# Patient Record
Sex: Male | Born: 1960
Health system: Southern US, Community
[De-identification: ages and names within clinical notes are randomized; demographics above are authoritative.]

## PROBLEM LIST (undated history)

## (undated) DIAGNOSIS — J45909 Unspecified asthma, uncomplicated: Secondary | ICD-10-CM

## (undated) HISTORY — PX: NECK SURGERY: SHX720

---

## 2019-02-10 ENCOUNTER — Encounter (HOSPITAL_COMMUNITY): Payer: Self-pay | Admitting: Emergency Medicine

## 2019-02-10 ENCOUNTER — Other Ambulatory Visit: Payer: Self-pay

## 2019-02-10 ENCOUNTER — Emergency Department (HOSPITAL_COMMUNITY): Payer: Managed Care, Other (non HMO)

## 2019-02-10 ENCOUNTER — Inpatient Hospital Stay (HOSPITAL_COMMUNITY)
Admission: EM | Disposition: A | Discharge status: Home / Self Care | Payer: Self-pay | Source: Home / Self Care | Attending: Cardiovascular Disease

## 2019-02-10 ENCOUNTER — Inpatient Hospital Stay (HOSPITAL_COMMUNITY)
Admission: EM | Admit: 2019-02-10 | Discharge: 2019-02-12 | DRG: 247 | Disposition: A | Discharge status: Home / Self Care | Payer: Managed Care, Other (non HMO) | Attending: Cardiovascular Disease | Admitting: Cardiovascular Disease

## 2019-02-10 ENCOUNTER — Inpatient Hospital Stay (HOSPITAL_COMMUNITY): Payer: Managed Care, Other (non HMO)

## 2019-02-10 DIAGNOSIS — I10 Essential (primary) hypertension: Secondary | ICD-10-CM | POA: Diagnosis present

## 2019-02-10 DIAGNOSIS — J45909 Unspecified asthma, uncomplicated: Secondary | ICD-10-CM

## 2019-02-10 DIAGNOSIS — I213 ST elevation (STEMI) myocardial infarction of unspecified site: Secondary | ICD-10-CM | POA: Diagnosis present

## 2019-02-10 DIAGNOSIS — I2102 ST elevation (STEMI) myocardial infarction involving left anterior descending coronary artery: Principal | ICD-10-CM | POA: Diagnosis present

## 2019-02-10 DIAGNOSIS — Z23 Encounter for immunization: Secondary | ICD-10-CM

## 2019-02-10 DIAGNOSIS — F17210 Nicotine dependence, cigarettes, uncomplicated: Secondary | ICD-10-CM | POA: Diagnosis present

## 2019-02-10 DIAGNOSIS — Z955 Presence of coronary angioplasty implant and graft: Secondary | ICD-10-CM

## 2019-02-10 DIAGNOSIS — I5021 Acute systolic (congestive) heart failure: Secondary | ICD-10-CM | POA: Diagnosis not present

## 2019-02-10 DIAGNOSIS — I2510 Atherosclerotic heart disease of native coronary artery without angina pectoris: Secondary | ICD-10-CM | POA: Diagnosis present

## 2019-02-10 DIAGNOSIS — Z720 Tobacco use: Secondary | ICD-10-CM

## 2019-02-10 DIAGNOSIS — E785 Hyperlipidemia, unspecified: Secondary | ICD-10-CM | POA: Diagnosis present

## 2019-02-10 DIAGNOSIS — E782 Mixed hyperlipidemia: Secondary | ICD-10-CM | POA: Diagnosis not present

## 2019-02-10 DIAGNOSIS — Z20828 Contact with and (suspected) exposure to other viral communicable diseases: Secondary | ICD-10-CM | POA: Diagnosis present

## 2019-02-10 HISTORY — DX: Unspecified asthma, uncomplicated: J45.909

## 2019-02-10 HISTORY — PX: CORONARY/GRAFT ACUTE MI REVASCULARIZATION: CATH118305

## 2019-02-10 HISTORY — PX: CORONARY STENT INTERVENTION: CATH118234

## 2019-02-10 HISTORY — PX: LEFT HEART CATH AND CORONARY ANGIOGRAPHY: CATH118249

## 2019-02-10 LAB — PROTIME-INR
INR: 0.9 (ref 0.8–1.2)
Prothrombin Time: 12.5 s (ref 11.4–15.2)

## 2019-02-10 LAB — CBC
HCT: 44.9 % (ref 39.0–52.0)
Hemoglobin: 15.3 g/dL (ref 13.0–17.0)
MCH: 33 pg (ref 26.0–34.0)
MCHC: 34.1 g/dL (ref 30.0–36.0)
MCV: 96.8 fL (ref 80.0–100.0)
Platelets: 226 10*3/uL (ref 150–400)
RBC: 4.64 MIL/uL (ref 4.22–5.81)
RDW: 12.6 % (ref 11.5–15.5)
WBC: 7 10*3/uL (ref 4.0–10.5)
nRBC: 0 % (ref 0.0–0.2)

## 2019-02-10 LAB — BASIC METABOLIC PANEL
Anion gap: 11 (ref 5–15)
BUN: 19 mg/dL (ref 6–20)
CO2: 24 mmol/L (ref 22–32)
Calcium: 9.5 mg/dL (ref 8.9–10.3)
Chloride: 106 mmol/L (ref 98–111)
Creatinine, Ser: 1.18 mg/dL (ref 0.61–1.24)
GFR calc Af Amer: >60 mL/min (ref >60)
GFR calc non Af Amer: >60 mL/min (ref >60)
Glucose, Bld: 96 mg/dL (ref 70–99)
Potassium: 4.3 mmol/L (ref 3.5–5.1)
Sodium: 141 mmol/L (ref 135–145)

## 2019-02-10 LAB — LIPID PANEL
Cholesterol: 181 mg/dL (ref 0–200)
HDL: 48 mg/dL (ref >40)
LDL Cholesterol: 70 mg/dL (ref 0–99)
Total CHOL/HDL Ratio: 3.8 RATIO
Triglycerides: 316 mg/dL — ABNORMAL HIGH (ref <150)
VLDL: 63 mg/dL — ABNORMAL HIGH (ref 0–40)

## 2019-02-10 LAB — TROPONIN I (HIGH SENSITIVITY)
Troponin I (High Sensitivity): 98 ng/L — ABNORMAL HIGH
Troponin I (High Sensitivity): >27000 ng/L

## 2019-02-10 LAB — ECHOCARDIOGRAM COMPLETE
Height: 67 in
Weight: 3200 oz

## 2019-02-10 LAB — MRSA PCR SCREENING: MRSA by PCR: NEGATIVE

## 2019-02-10 LAB — SARS CORONAVIRUS 2 BY RT PCR (HOSPITAL ORDER, PERFORMED IN ~~LOC~~ HOSPITAL LAB): SARS Coronavirus 2: NEGATIVE

## 2019-02-10 LAB — APTT: aPTT: 26 s (ref 24–36)

## 2019-02-10 SURGERY — CORONARY/GRAFT ACUTE MI REVASCULARIZATION
Anesthesia: LOCAL

## 2019-02-10 MED ORDER — SODIUM CHLORIDE 0.9% FLUSH
3.0000 mL | INTRAVENOUS | Status: DC | PRN
  Administered 2019-02-12: 10:00:00 3 mL via INTRAVENOUS
  Filled 2019-02-10: qty 3

## 2019-02-10 MED ORDER — TICAGRELOR 90 MG PO TABS
ORAL_TABLET | ORAL | Status: AC
  Filled 2019-02-10: qty 2

## 2019-02-10 MED ORDER — NITROGLYCERIN 1 MG/10 ML FOR IR/CATH LAB
INTRA_ARTERIAL | Status: AC
  Filled 2019-02-10: qty 10

## 2019-02-10 MED ORDER — SODIUM CHLORIDE 0.9 % IV SOLN: 1.7500 mg/kg/h | Freq: Once | INTRAVENOUS | Status: DC

## 2019-02-10 MED ORDER — HEPARIN SODIUM (PORCINE) 5000 UNIT/ML IJ SOLN
4000.0000 [IU] | Freq: Once | INTRAMUSCULAR | Status: AC
  Administered 2019-02-10: 04:00:00 4000 [IU] via INTRAVENOUS
  Filled 2019-02-10: qty 1

## 2019-02-10 MED ORDER — BIVALIRUDIN BOLUS VIA INFUSION - CUPID
INTRAVENOUS | Status: DC | PRN
  Administered 2019-02-10: 05:00:00 68.025 mg via INTRAVENOUS

## 2019-02-10 MED ORDER — ACETAMINOPHEN 325 MG PO TABS: 650.0000 mg | ORAL_TABLET | ORAL | Status: DC | PRN

## 2019-02-10 MED ORDER — SODIUM CHLORIDE 0.9 % IV SOLN
INTRAVENOUS | Status: AC | PRN
  Administered 2019-02-10: 10 mL/h via INTRAVENOUS

## 2019-02-10 MED ORDER — ASPIRIN 81 MG PO CHEW
324.0000 mg | CHEWABLE_TABLET | Freq: Once | ORAL | Status: AC
  Administered 2019-02-10: 324 mg via ORAL
  Filled 2019-02-10: qty 4

## 2019-02-10 MED ORDER — SODIUM CHLORIDE 0.9% FLUSH: 3.0000 mL | Freq: Once | INTRAVENOUS | Status: DC

## 2019-02-10 MED ORDER — BIVALIRUDIN TRIFLUOROACETATE 250 MG IV SOLR
INTRAVENOUS | Status: AC
  Filled 2019-02-10: qty 250

## 2019-02-10 MED ORDER — HEPARIN (PORCINE) IN NACL 1000-0.9 UT/500ML-% IV SOLN
INTRAVENOUS | Status: AC
  Filled 2019-02-10: qty 1500

## 2019-02-10 MED ORDER — FENTANYL CITRATE (PF) 100 MCG/2ML IJ SOLN
INTRAMUSCULAR | Status: AC
  Filled 2019-02-10: qty 2

## 2019-02-10 MED ORDER — VERAPAMIL HCL 2.5 MG/ML IV SOLN
INTRAVENOUS | Status: AC
  Filled 2019-02-10: qty 2

## 2019-02-10 MED ORDER — ATORVASTATIN CALCIUM 80 MG PO TABS
80.0000 mg | ORAL_TABLET | Freq: Every day | ORAL | Status: DC
  Administered 2019-02-10 – 2019-02-11 (×2): 80 mg via ORAL
  Filled 2019-02-10 (×2): qty 1

## 2019-02-10 MED ORDER — TICAGRELOR 90 MG PO TABS
ORAL_TABLET | ORAL | Status: DC | PRN
  Administered 2019-02-10: 180 mg via ORAL

## 2019-02-10 MED ORDER — CHLORHEXIDINE GLUCONATE CLOTH 2 % EX PADS
6.0000 | MEDICATED_PAD | Freq: Every day | CUTANEOUS | Status: DC
  Administered 2019-02-11: 6 via TOPICAL

## 2019-02-10 MED ORDER — ONDANSETRON HCL 4 MG/2ML IJ SOLN
INTRAMUSCULAR | Status: DC | PRN
  Administered 2019-02-10: 4 mg via INTRAVENOUS

## 2019-02-10 MED ORDER — TICAGRELOR 90 MG PO TABS
90.0000 mg | ORAL_TABLET | Freq: Two times a day (BID) | ORAL | Status: DC
  Administered 2019-02-10 – 2019-02-12 (×4): 90 mg via ORAL
  Filled 2019-02-10 (×4): qty 1

## 2019-02-10 MED ORDER — FENTANYL CITRATE (PF) 100 MCG/2ML IJ SOLN
INTRAMUSCULAR | Status: DC | PRN
  Administered 2019-02-10: 25 ug via INTRAVENOUS

## 2019-02-10 MED ORDER — SODIUM CHLORIDE 0.9 % IV SOLN
INTRAVENOUS | Status: DC
  Administered 2019-02-10: 04:00:00 20 mL/h via INTRAVENOUS

## 2019-02-10 MED ORDER — METOPROLOL TARTRATE 12.5 MG HALF TABLET
12.5000 mg | ORAL_TABLET | Freq: Two times a day (BID) | ORAL | Status: DC
  Administered 2019-02-10 – 2019-02-11 (×4): 12.5 mg via ORAL
  Filled 2019-02-10 (×5): qty 1

## 2019-02-10 MED ORDER — HEPARIN SODIUM (PORCINE) 1000 UNIT/ML IJ SOLN
INTRAMUSCULAR | Status: DC | PRN
  Administered 2019-02-10: 2000 [IU] via INTRAVENOUS

## 2019-02-10 MED ORDER — LIDOCAINE HCL (PF) 1 % IJ SOLN
INTRAMUSCULAR | Status: AC
  Filled 2019-02-10: qty 30

## 2019-02-10 MED ORDER — VERAPAMIL HCL 2.5 MG/ML IV SOLN
INTRAVENOUS | Status: DC | PRN
  Administered 2019-02-10: 10 mL via INTRA_ARTERIAL

## 2019-02-10 MED ORDER — LABETALOL HCL 5 MG/ML IV SOLN: 10.0000 mg | INTRAVENOUS | Status: AC | PRN

## 2019-02-10 MED ORDER — PERFLUTREN LIPID MICROSPHERE
1.0000 mL | INTRAVENOUS | Status: AC | PRN
  Administered 2019-02-10: 1.5 mL via INTRAVENOUS
  Filled 2019-02-10: qty 10

## 2019-02-10 MED ORDER — HYDRALAZINE HCL 20 MG/ML IJ SOLN: 10.0000 mg | INTRAMUSCULAR | Status: AC | PRN

## 2019-02-10 MED ORDER — LIDOCAINE HCL (PF) 1 % IJ SOLN
INTRAMUSCULAR | Status: DC | PRN
  Administered 2019-02-10: 2 mL

## 2019-02-10 MED ORDER — ASPIRIN 81 MG PO CHEW
81.0000 mg | CHEWABLE_TABLET | Freq: Every day | ORAL | Status: DC
  Administered 2019-02-10 – 2019-02-12 (×3): 81 mg via ORAL
  Filled 2019-02-10 (×3): qty 1

## 2019-02-10 MED ORDER — HEPARIN SODIUM (PORCINE) 1000 UNIT/ML IJ SOLN
INTRAMUSCULAR | Status: AC
  Filled 2019-02-10: qty 1

## 2019-02-10 MED ORDER — SODIUM CHLORIDE 0.9% FLUSH
3.0000 mL | Freq: Two times a day (BID) | INTRAVENOUS | Status: DC
  Administered 2019-02-10 – 2019-02-11 (×3): 3 mL via INTRAVENOUS

## 2019-02-10 MED ORDER — HEPARIN (PORCINE) IN NACL 1000-0.9 UT/500ML-% IV SOLN
INTRAVENOUS | Status: DC | PRN
  Administered 2019-02-10: 500 mL

## 2019-02-10 MED ORDER — MIDAZOLAM HCL 2 MG/2ML IJ SOLN
INTRAMUSCULAR | Status: AC
  Filled 2019-02-10: qty 2

## 2019-02-10 MED ORDER — SODIUM CHLORIDE 0.9 % IV SOLN: 250.0000 mL | INTRAVENOUS | Status: DC | PRN

## 2019-02-10 MED ORDER — SODIUM CHLORIDE 0.9 % IV SOLN
INTRAVENOUS | Status: DC
  Administered 2019-02-10: 12:00:00 via INTRAVENOUS

## 2019-02-10 MED ORDER — IOHEXOL 350 MG/ML SOLN
INTRAVENOUS | Status: DC | PRN
  Administered 2019-02-10: 06:00:00 190 mL

## 2019-02-10 MED ORDER — ONDANSETRON HCL 4 MG/2ML IJ SOLN
INTRAMUSCULAR | Status: AC
  Filled 2019-02-10: qty 2

## 2019-02-10 MED ORDER — ONDANSETRON HCL 4 MG/2ML IJ SOLN: 4.0000 mg | Freq: Four times a day (QID) | INTRAMUSCULAR | Status: DC | PRN

## 2019-02-10 MED ORDER — NITROGLYCERIN 0.4 MG SL SUBL
0.4000 mg | SUBLINGUAL_TABLET | SUBLINGUAL | Status: DC | PRN
  Administered 2019-02-10 (×3): 0.4 mg via SUBLINGUAL
  Filled 2019-02-10: qty 1

## 2019-02-10 MED ORDER — ALBUTEROL SULFATE (2.5 MG/3ML) 0.083% IN NEBU: 3.0000 mL | INHALATION_SOLUTION | Freq: Four times a day (QID) | RESPIRATORY_TRACT | Status: DC | PRN

## 2019-02-10 MED ORDER — MIDAZOLAM HCL 2 MG/2ML IJ SOLN
INTRAMUSCULAR | Status: DC | PRN
  Administered 2019-02-10: 2 mg via INTRAVENOUS

## 2019-02-10 MED ORDER — SODIUM CHLORIDE 0.9 % IV SOLN
INTRAVENOUS | Status: AC | PRN
  Administered 2019-02-10 (×2): 1.75 mg/kg/h via INTRAVENOUS

## 2019-02-10 SURGICAL SUPPLY — 19 items
BALLN SAPPHIRE 2.0X12 (BALLOONS) ×2
BALLN SAPPHIRE 2.5X15 (BALLOONS) ×2
BALLN SAPPHIRE ~~LOC~~ 3.25X18 (BALLOONS) ×2 IMPLANT
BALLOON SAPPHIRE 2.0X12 (BALLOONS) ×1 IMPLANT
BALLOON SAPPHIRE 2.5X15 (BALLOONS) ×1 IMPLANT
CATH INFINITI JR4 5F (CATHETERS) ×2 IMPLANT
CATH OPTITORQUE TIG 4.0 5F (CATHETERS) ×2 IMPLANT
CATH VISTA GUIDE 6FR XBLAD3.5 (CATHETERS) ×2 IMPLANT
DEVICE RAD COMP TR BAND LRG (VASCULAR PRODUCTS) ×2 IMPLANT
GLIDESHEATH SLEND SS 6F .021 (SHEATH) ×2 IMPLANT
GUIDEWIRE INQWIRE 1.5J.035X260 (WIRE) ×1 IMPLANT
INQWIRE 1.5J .035X260CM (WIRE) ×2
KIT ENCORE 26 ADVANTAGE (KITS) ×2 IMPLANT
KIT HEART LEFT (KITS) ×2 IMPLANT
PACK CARDIAC CATHETERIZATION (CUSTOM PROCEDURE TRAY) ×2 IMPLANT
STENT SYNERGY DES 3X32 (Permanent Stent) ×2 IMPLANT
TRANSDUCER W/STOPCOCK (MISCELLANEOUS) ×2 IMPLANT
TUBING CIL FLEX 10 FLL-RA (TUBING) ×2 IMPLANT
WIRE PT2 MS 185 (WIRE) ×2 IMPLANT

## 2019-02-10 NOTE — ED Triage Notes (Addendum)
C/o chest pressure to center of chest that radiates up into neck that woke him up this morning.  Denies SOB.  Reports nausea and vomiting this AM.  States this has happened every other night x 3.  States today is first day with nausea and vomiting.  EKG on arrival shows STEMI.  Pt straight to room after EKG.

## 2019-02-10 NOTE — Plan of Care (Signed)
  Problem: Education: Goal: Knowledge of General Education information will improve Description: Including pain rating scale, medication(s)/side effects and non-pharmacologic comfort measures Outcome: Progressing   Problem: Health Behavior/Discharge Planning: Goal: Ability to manage health-related needs will improve Outcome: Progressing   Problem: Clinical Measurements: Goal: Ability to maintain clinical measurements within normal limits will improve Outcome: Progressing Goal: Will remain free from infection Outcome: Progressing Goal: Respiratory complications will improve Outcome: Progressing Goal: Cardiovascular complication will be avoided Outcome: Progressing   Problem: Activity: Goal: Risk for activity intolerance will decrease Outcome: Progressing   Problem: Nutrition: Goal: Adequate nutrition will be maintained Outcome: Progressing   Problem: Coping: Goal: Level of anxiety will decrease Outcome: Progressing   Problem: Elimination: Goal: Will not experience complications related to bowel motility Outcome: Progressing Goal: Will not experience complications related to urinary retention Outcome: Progressing   Problem: Pain Managment: Goal: General experience of comfort will improve Outcome: Progressing   Problem: Safety: Goal: Ability to remain free from injury will improve Outcome: Progressing   Problem: Skin Integrity: Goal: Risk for impaired skin integrity will decrease Outcome: Progressing   Problem: Activity: Goal: Ability to return to baseline activity level will improve Outcome: Progressing   Problem: Cardiovascular: Goal: Ability to achieve and maintain adequate cardiovascular perfusion will improve Outcome: Progressing Goal: Vascular access site(s) Level 0-1 will be maintained Outcome: Completed/Met   Problem: Health Behavior/Discharge Planning: Goal: Ability to safely manage health-related needs after discharge will improve Outcome:  Progressing

## 2019-02-10 NOTE — Progress Notes (Signed)
Echocardiogram 2D Echocardiogram has been performed.  Oneal Deputy Dmarion Perfect 02/10/2019, 9:13 AM

## 2019-02-10 NOTE — ED Provider Notes (Signed)
[facility] Note   CSN: [id number] Arrival date & time: 02/10/19  0345     History   Chief Complaint Chief Complaint  Patient presents with  . Code STEMI   Level 5 caveat due to acuity of condition HPI Cory Berry is a 58 y.o. male.     The history is provided by the patient.  Chest Pain Pain location:  Substernal area Pain quality: pressure   Pain radiates to:  Neck Pain severity:  Severe Onset quality:  Sudden Duration:  1 hour Timing:  Constant Progression:  Worsening Chronicity:  New Relieved by:  Nothing Worsened by:  Nothing Associated symptoms: diaphoresis and weakness   Patient history of asthma presents with chest pain.  He reports he has had chest pain over the past 3 nights.  Tonight he woke up with severe pain that went into his neck Denies known history of CAD. Patient is a smoker Past Medical History:  Diagnosis Date  . Asthma     There are no active problems to display for this patient.   Past Surgical History:  Procedure Laterality Date  . NECK SURGERY          Home Medications    Prior to Admission medications   Not on File    Family History No family history on file.  Social History Social History   Tobacco Use  . Smoking status: Current Every Day Smoker  . Smokeless tobacco: Never Used  Substance Use Topics  . Alcohol use: Yes  . Drug use: Never     Allergies   Patient has no allergy information on record.   Review of Systems Review of Systems  Unable to perform ROS: Acuity of condition  Constitutional: Positive for diaphoresis.  Cardiovascular: Positive for chest pain.  Neurological: Positive for weakness.     Physical Exam Updated Vital Signs There were no vitals taken for this visit.  Physical Exam CONSTITUTIONAL: Well developed/well nourished, uncomfortable appearing HEAD: Normocephalic/atraumatic EYES: EOMI ENMT: Mucous membranes moist NECK: supple  no meningeal signs SPINE/BACK:entire spine nontender CV: S1/S2 noted, no murmurs/rubs/gallops noted LUNGS: Lungs are clear to auscultation bilaterally, no apparent distress ABDOMEN: soft, nontender NEURO: Pt is awake/alert/appropriate, moves all extremitiesx4.  No facial droop.   EXTREMITIES: pulses normal/equalx4, full ROM SKIN: warm, color normal PSYCH: Mildly anxious  ED Treatments / Results  Labs (all labs ordered are listed, but only abnormal results are displayed) Labs Reviewed  SARS CORONAVIRUS 2 (HOSPITAL ORDER, PERFORMED IN [facility])  CBC  PROTIME-INR  APTT  BASIC METABOLIC PANEL  LIPID PANEL  TROPONIN I (HIGH SENSITIVITY)    EKG EKG Interpretation  Date/Time:  Saturday February 10 2019 03:49:27 EDT Ventricular Rate:  105 PR Interval:  160 QRS Duration: 80 QT Interval:  324 QTC Calculation: 428 R Axis:   83 Text Interpretation:  Sinus tachycardia Septal infarct , age undetermined Lateral injury pattern ** ** ** * * ACUTE MI  ** ** ** ** Abnormal ECG No previous ECGs available Confirmed by Zadie RhineWickline, Donatella Walski ([id number]) on 02/10/2019 4:03:44 AM   Radiology Dg Chest Port 1 View  Result Date: 02/10/2019 CLINICAL DATA:  C/o chest pressure to center of chest that radiates up into neck that woke him up this morning. Denies SOB. Reports nausea and vomiting this AM. States this has happened every other night x 3. States today is first day with nausea and vomiting. EKG on arrival shows STEMI. Pt straight to room after  EKG EXAM: PORTABLE CHEST 1 VIEW COMPARISON:  None. FINDINGS: Cardiac silhouette is normal in size. Normal mediastinal and hilar contours. Clear lungs.  No pleural effusion or pneumothorax. Skeletal structures are grossly intact. IMPRESSION: No active disease. Electronically Signed   By: Lajean Manes M.D.   On: 02/10/2019 04:15    Procedures .Critical Care Performed by: Ripley Fraise, MD Authorized by: Ripley Fraise, MD   Critical care  provider statement:    Critical care time (minutes):  31   Critical care start time:  02/10/2019 4:09 AM   Critical care end time:  02/10/2019 4:40 AM   Critical care time was exclusive of:  Separately billable procedures and treating other patients   Critical care was necessary to treat or prevent imminent or life-threatening deterioration of the following conditions:  Cardiac failure   Critical care was time spent personally by me on the following activities:  Discussions with consultants, evaluation of patient's response to treatment, examination of patient, pulse oximetry, re-evaluation of patient's condition, ordering and review of radiographic studies and ordering and review of laboratory studies   I assumed direction of critical care for this patient from another provider in my specialty: no       Medications Ordered in ED Medications  sodium chloride flush (NS) 0.9 % injection 3 mL ( Intravenous MAR Hold 02/10/19 0443)  0.9 %  sodium chloride infusion (20 mL/hr Intravenous New Bag/Given 02/10/19 0422)  nitroGLYCERIN (NITROSTAT) SL tablet 0.4 mg ( Sublingual MAR Hold 02/10/19 0443)  ondansetron (ZOFRAN) injection (4 mg Intravenous Given 02/10/19 0452)  aspirin chewable tablet 324 mg (324 mg Oral Given 02/10/19 0406)  heparin injection 4,000 Units (4,000 Units Intravenous Given 02/10/19 0407)  verapamil (ISOPTIN) 2.5 MG/ML injection (has no administration in time range)  heparin 1000 UNIT/ML injection (has no administration in time range)  lidocaine (PF) (XYLOCAINE) 1 % injection (has no administration in time range)  Heparin (Porcine) in NaCl 1000-0.9 UT/500ML-% SOLN (has no administration in time range)  ondansetron (ZOFRAN) 4 MG/2ML injection (has no administration in time range)     Initial Impression / Assessment and Plan / ED Course  I have reviewed the triage vital signs and the nursing notes.  Pertinent labs & imaging results that were available during my care of the patient were  reviewed by me and considered in my medical decision making (see chart for details).        Immediately upon review of EKG I paged out a code STEMI.  Repeat EKG reveals continued ST elevation.  Discussed via phone with Dr. Claiborne Billings with cardiology.  Patient will go emergently to the cardiac Cath Lab 4:19 AM Cardiology at bedside Pt has some improvement in pain with NTG  Final Clinical Impressions(s) / ED Diagnoses   Final diagnoses:  ST elevation myocardial infarction (STEMI), unspecified artery [facility])    ED Discharge Orders    None       Ripley Fraise, MD 02/10/19 [phone]

## 2019-02-10 NOTE — Progress Notes (Signed)
Progress Note  Patient Name: Cory Berry Date of Encounter: 02/10/2019  Primary Cardiologist: No primary care provider on file.   Subjective   No chest pain or sob.  Inpatient Medications    Scheduled Meds: . aspirin  81 mg Oral Daily  . atorvastatin  80 mg Oral q1800  . Chlorhexidine Gluconate Cloth  6 each Topical Daily  . metoprolol tartrate  12.5 mg Oral BID  . sodium chloride flush  3 mL Intravenous Once  . sodium chloride flush  3 mL Intravenous Q12H  . ticagrelor  90 mg Oral BID   Continuous Infusions: . sodium chloride 125 mL/hr at 02/10/19 0701  . sodium chloride    . bivalirudin (ANGIOMAX) infusion 5 mg/mL (Cath Lab,ACS,PCI indication)     PRN Meds: sodium chloride, acetaminophen, albuterol, hydrALAZINE, labetalol, nitroGLYCERIN, ondansetron (ZOFRAN) IV, perflutren lipid microspheres (DEFINITY) IV suspension, sodium chloride flush   Vital Signs    Vitals:   02/10/19 0600 02/10/19 0700 02/10/19 0748 02/10/19 0800  BP: 122/82 118/87  130/81  Pulse: 88 85  85  Resp:  17  15  Temp:   98.3 F (36.8 C)   TempSrc:   Oral   SpO2: 99% 100%  99%  Weight:      Height:        Intake/Output Summary (Last 24 hours) at 02/10/2019 0944 Last data filed at 02/10/2019 0800 Gross per 24 hour  Intake 246.44 ml  Output -  Net 246.44 ml   Filed Weights   02/10/19 0412  Weight: 90.7 kg    Telemetry    nsr - Personally Reviewed  ECG    nsr with high lateral ST elevation  - Personally Reviewed  Physical Exam   GEN: No acute distress.   Neck: No JVD Cardiac: RRR, no murmurs, rubs, or gallops.  Respiratory: Clear to auscultation bilaterally. GI: Soft, nontender, non-distended  MS: No edema; No deformity. Neuro:  Nonfocal  Psych: Normal affect   Labs    Chemistry Recent Labs  Lab 02/10/19 0357  NA 141  K 4.3  CL 106  CO2 24  GLUCOSE 96  BUN 19  CREATININE 1.18  CALCIUM 9.5  GFRNONAA >60  GFRAA >60  ANIONGAP 11     Hematology Recent Labs   Lab 02/10/19 0357  WBC 7.0  RBC 4.64  HGB 15.3  HCT 44.9  MCV 96.8  MCH 33.0  MCHC 34.1  RDW 12.6  PLT 226    Cardiac EnzymesNo results for input(s): TROPONINI in the last 168 hours. No results for input(s): TROPIPOC in the last 168 hours.   BNPNo results for input(s): BNP, PROBNP in the last 168 hours.   DDimer No results for input(s): DDIMER in the last 168 hours.   Radiology    Dg Chest Port 1 View  Result Date: 02/10/2019 CLINICAL DATA:  C/o chest pressure to center of chest that radiates up into neck that woke him up this morning. Denies SOB. Reports nausea and vomiting this AM. States this has happened every other night x 3. States today is first day with nausea and vomiting. EKG on arrival shows STEMI. Pt straight to room after EKG EXAM: PORTABLE CHEST 1 VIEW COMPARISON:  None. FINDINGS: Cardiac silhouette is normal in size. Normal mediastinal and hilar contours. Clear lungs.  No pleural effusion or pneumothorax. Skeletal structures are grossly intact. IMPRESSION: No active disease. Electronically Signed   By: Lajean Manes M.D.   On: 02/10/2019 04:15    Cardiac  Studies   Left heart cath with PCI  Patient Profile     58 y.o. male admitted with a high lateral AMI and found to have an occluded LAD  Assessment & Plan    1. Acute anterolateral MI - he is doing well s/p PCI with stenting of the LAD. Continue ticagrelor and ASA.  2. Tobacco abuse - he stopped in the past for 23 years. I encouraged him to stop smoking.  3. Dyslipidemia - he has started high dose atorvastatin.  For questions or updates, please contact [facility] Please consult [email or web address] for contact info under Cardiology/STEMI.      Signed, Lewayne BuntingGregg Fedra Lanter, MD  02/10/2019, 9:44 AM  Patient ID: Cory Berry, male   DOB: 1960/12/27, 58 y.o.   MRN: [id number]

## 2019-02-10 NOTE — Progress Notes (Signed)
Responded to code STEMI, placed on zoll, assisted with transport to cath lab.

## 2019-02-10 NOTE — Progress Notes (Signed)
   02/10/19 0800  Clinical Encounter Type  Visited With Family;Patient and family together;Health care provider  Visit Type Initial;ED;Code  Referral From Other (Comment) (code STEMI)  Spiritual Encounters  Spiritual Needs Emotional  Stress Factors  Patient Stress Factors Loss of control;Health changes  Family Stress Factors Loss of control   Chaplain called, then presented to ED for STEMI.  Met w/ pt's wife, who was especially worried in light of her father's previous death in cardiac surgery.  Walked her to [facility] waiting, team aware, compassionate presence w/ her for a time.  Aware that she could go to [place] to ask to page chaplain if desired I return.  Temple Pacini [place], [phone]

## 2019-02-10 NOTE — Progress Notes (Signed)
CARDIAC REHAB PHASE I   Education completed including stent card information, medications, risk factors, diet, and smoking cessation.  Pt has quit for numerous years before and feels that he will be able to quit without the use of an aid.  All questions answered.  Verbalized understanding.  CRP2 Referral made to [facility] d/t its proximity to the pt's work.  Noel Christmas, RN 02/10/2019 2:45 PM

## 2019-02-10 NOTE — Plan of Care (Signed)
  Problem: Education: Goal: Knowledge of General Education information will improve Description: Including pain rating scale, medication(s)/side effects and non-pharmacologic comfort measures Outcome: Progressing   Problem: Clinical Measurements: Goal: Ability to maintain clinical measurements within normal limits will improve Outcome: Progressing Goal: Will remain free from infection Outcome: Progressing Goal: Respiratory complications will improve Outcome: Progressing Goal: Cardiovascular complication will be avoided Outcome: Progressing   Problem: Activity: Goal: Risk for activity intolerance will decrease Outcome: Progressing   Problem: Coping: Goal: Level of anxiety will decrease Outcome: Progressing   Problem: Elimination: Goal: Will not experience complications related to urinary retention Outcome: Progressing   Problem: Pain Managment: Goal: General experience of comfort will improve Outcome: Progressing   Problem: Safety: Goal: Ability to remain free from injury will improve Outcome: Progressing   Problem: Skin Integrity: Goal: Risk for impaired skin integrity will decrease Outcome: Progressing   Problem: Education: Goal: Understanding of CV disease, CV risk reduction, and recovery process will improve Outcome: Progressing   Problem: Activity: Goal: Ability to return to baseline activity level will improve Outcome: Progressing   Problem: Cardiovascular: Goal: Ability to achieve and maintain adequate cardiovascular perfusion will improve Outcome: Progressing

## 2019-02-10 NOTE — ED Notes (Signed)
Cardiology at the bedside.

## 2019-02-10 NOTE — H&P (Addendum)
Cardiology Admission History and Physical:   Patient ID: Markice Halliwell MRN: [id number]; DOB: 1960/07/21   Admission date: 02/10/2019  Primary Care Provider: No primary care provider on file. Primary Cardiologist: No primary care provider on file.  Primary Electrophysiologist:  None   Chief Complaint:  Chest pain  Patient Profile:   Chosen Beilke is a 58 y.o. male with PMH significant for tobacco abuse and asthma who presents with chest pain with ECG consistent with STEMI.  History of Present Illness:   Mr. Steimer was in his usual state of health until the past 6 days when he started to develop intermittent chest pain at night. He woke up from sleep about 6 nights ago with chest pain. He called EMS who evaluated him and noted his vitals and ECG to be normal. His chest pain resolved. He did not have chest pain the following night but then did the third night. His pain was similar that time and he took some nexium and went back to bed. Last evening, he did not have chest pain again. Tonight, he woke up from sleep with the same chest pain that he describes as a squeezing discomfort in the center of his chest. This time was worse and associated with nausea, vomiting, and diaphoresis. He and his wife drove to the ER given his symptoms. His ECG was remarkable for ST elevation in V5 and V6 with reciprocal changes. He was given 3 SL nitro tablets with improvement in his pain to 3/10 but persistent discomfort. He was brought to the cath lab where he was found to have an occlusion of his LAD.   He smokes 2+ppd and has for about the past 12 years. He has 2-4 beers each night. He denies any drug use. 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 He typically walks multiple miles in a day and denies having any chest pain or shortness of breath with exertion. His parents and two siblings are all alive and healthy. He has no family history of coronary disease. Of note, he has not been to see a  doctor since he moved to the [place] 12 years ago.   Heart Pathway Score:     Past Medical History:  Diagnosis Date  . Asthma     Past Surgical History:  Procedure Laterality Date  . NECK SURGERY       Medications Prior to Admission: None  Allergies:   None  Social History:   Lives at home with his wife. Smokes ~2ppd. Drinks 2-4 beers a day. No drug use. Works as an Nature conservation officer.    Family History:   No family history of heart disease.    ROS:  Please see the history of present illness.  All other ROS reviewed and negative.     Physical Exam/Data:   Vitals:   02/10/19 0414 02/10/19 0415 02/10/19 0421 02/10/19 0430  BP:  (!) 139/96 (!) 139/96 114/70  Pulse: (!) 107 (!) 108 (!) 103 98  Resp: 17 14 14 18   Temp:   97.9 F (36.6 C)   TempSrc:   Oral   SpO2: 100% 100% 100% 100%  Weight:      Height:       No intake or output data in the 24 hours ending 02/10/19 0455 Last 3 Weights 02/10/2019  Weight (lbs) 200 lb  Weight (kg) 90.719 kg     Body mass index is 31.32 kg/m.  General:  Well nourished, well developed, diaphoretic, lying  comfortably in bed HEENT: normal Lymph: no adenopathy Neck: no JVD Vascular: No carotid bruits; FA pulses 2+ bilaterally without bruits  Cardiac:  normal S1, S2; RRR; no murmurs, rubs or gallops Lungs:  clear to auscultation bilaterally, no wheezing, rhonchi or rales  Abd: soft, nontender, no hepatomegaly  Ext: no edema, warm and well perfused Musculoskeletal:  No deformities, BUE and BLE strength normal and equal Skin: warm and dry  Neuro:  CNs 2-12 intact, no focal abnormalities noted Psych:  Normal affect    EKG:  The ECG that was done and was personally reviewed and demonstrates normal sinus rhythm with ST elevation in V5-V6  Relevant CV Studies: None  Laboratory Data:  High Sensitivity Troponin:  No results for input(s): TROPONINIHS in the last 720 hours.    ChemistryNo results for input(s): NA, K, CL, CO2, GLUCOSE,  BUN, CREATININE, CALCIUM, GFRNONAA, GFRAA, ANIONGAP in the last 168 hours.  No results for input(s): PROT, ALBUMIN, AST, ALT, ALKPHOS, BILITOT in the last 168 hours. Hematology Recent Labs  Lab 02/10/19 0357  WBC 7.0  RBC 4.64  HGB 15.3  HCT 44.9  MCV 96.8  MCH 33.0  MCHC 34.1  RDW 12.6  PLT 226   BNPNo results for input(s): BNP, PROBNP in the last 168 hours.  DDimer No results for input(s): DDIMER in the last 168 hours.   Radiology/Studies:  Dg Chest Port 1 View  Result Date: 02/10/2019 CLINICAL DATA:  C/o chest pressure to center of chest that radiates up into neck that woke him up this morning. Denies SOB. Reports nausea and vomiting this AM. States this has happened every other night x 3. States today is first day with nausea and vomiting. EKG on arrival shows STEMI. Pt straight to room after EKG EXAM: PORTABLE CHEST 1 VIEW COMPARISON:  None. FINDINGS: Cardiac silhouette is normal in size. Normal mediastinal and hilar contours. Clear lungs.  No pleural effusion or pneumothorax. Skeletal structures are grossly intact. IMPRESSION: No active disease. Electronically Signed   By: Lajean Manes M.D.   On: 02/10/2019 04:15    Assessment and Plan:   Aloysius Heinle is a 58 y.o. male with PMH significant for tobacco abuse and asthma who presents with chest pain with ECG consistent with STEMI.   LAD STEMI- Presented with chest pain and diaphoresis and was found to have an occluded LAD s/p PCI.  - S/p aspirin 324mg   - Start aspirin 81mg  daily - S/p ticagrelor 180mg   - Continue ticagrelor 90mg  BID  - Start lipitor 80mg   - Check HgA1c, lipid panel and TSH - Start metoprolol tartrate 12.5mg  BID  - Referral to cardiac rehab at discharge  - Encourage smoking cessation  Asthma - Continue albuterol PRN   FEN/GI - Regular diet - DVT PPx: Hold given cath - GI PPx: Not indicated  Code status: Full code    Severity of Illness: The appropriate patient status for this patient is  INPATIENT. Inpatient status is judged to be reasonable and necessary in order to provide the required intensity of service to ensure the patient's safety. The patient's presenting symptoms, physical exam findings, and initial radiographic and laboratory data in the context of their chronic comorbidities is felt to place them at high risk for further clinical deterioration. Furthermore, it is not anticipated that the patient will be medically stable for discharge from the hospital within 2 midnights of admission. The following factors support the patient status of inpatient.   " The patient's presenting symptoms include chest  pain and diaphoresis. " The worrisome physical exam findings include diaphoresis. " The initial radiographic and laboratory data are worrisome because of STEMI criteria on ECG. " The chronic co-morbidities include asthma.   * I certify that at the point of admission it is my clinical judgment that the patient will require inpatient hospital care spanning beyond 2 midnights from the point of admission due to high intensity of service, high risk for further deterioration and high frequency of surveillance required.*    For questions or updates, please contact [facility] Please consult [email or web address] for contact info under    Signed, Nicoletta Baoniglio, Amanda C, MD  02/10/2019 4:55 AM    Patient seen and examined. Agree with assessment and plan.  Mr. Maisie Fushomas is a 58 year old gentleman who is originally from the [place] who presented this evening to the emergency room after awakening from sleep with severe substernal chest pain associated with nausea vomiting and diaphoresis.  Over the past 6 days he has experienced 2 other episodes of similar chest discomfort but this evening's was most pronounced.  Several days ago EMS had gone to his house doing his symptoms and ECG did not show any acute ST-T changes.  Upon presentation to the emergency room, ECG revealed acute lateral ST segment  elevation involving leads I, aVL, V5 and V6 with inferior T wave inversion.  He was treated with heparin 4000 units and aspirin and transported emergently to the cardiac catheterization laboratory.  The procedure was discussed with the patient.  He has a history of asthma.  He had not been on any prior significant home medications except as needed albuterol.  He has not seen a physician since moving to the [place] 12 years ago.  Urgent catheterization was performed which revealed total occlusion of a large LAD which was successfully intervened upon with ultimate insertion of a 3.0 x 32 mm Synergy DES stent postdilated to 3.3 millimeters.  Following access for intervention, chest pain completely resolved.  He will be transported to the CCU for further observation and treatment.  Will DC bivalirudin following completion of his current second bag. We will plan echo Doppler study to assess LV function.  Will initiate low-dose beta-blocker and high potency statin therapy.  Continue DAPT for minimum of 1 year following this acute coronary syndrome/STEMI.  Lennette Biharihomas A. Kelly, MD, [facility] 02/10/2019 6:10 AM

## 2019-02-11 LAB — BASIC METABOLIC PANEL
Anion gap: 9 (ref 5–15)
BUN: 14 mg/dL (ref 6–20)
CO2: 22 mmol/L (ref 22–32)
Calcium: 8.8 mg/dL — ABNORMAL LOW (ref 8.9–10.3)
Chloride: 106 mmol/L (ref 98–111)
Creatinine, Ser: 1.12 mg/dL (ref 0.61–1.24)
GFR calc Af Amer: >60 mL/min (ref >60)
GFR calc non Af Amer: >60 mL/min (ref >60)
Glucose, Bld: 102 mg/dL — ABNORMAL HIGH (ref 70–99)
Potassium: 3.9 mmol/L (ref 3.5–5.1)
Sodium: 137 mmol/L (ref 135–145)

## 2019-02-11 LAB — CBC
HCT: 40.6 % (ref 39.0–52.0)
Hemoglobin: 13.4 g/dL (ref 13.0–17.0)
MCH: 32.1 pg (ref 26.0–34.0)
MCHC: 33 g/dL (ref 30.0–36.0)
MCV: 97.1 fL (ref 80.0–100.0)
Platelets: 194 10*3/uL (ref 150–400)
RBC: 4.18 MIL/uL — ABNORMAL LOW (ref 4.22–5.81)
RDW: 12.7 % (ref 11.5–15.5)
WBC: 9.7 10*3/uL (ref 4.0–10.5)
nRBC: 0 % (ref 0.0–0.2)

## 2019-02-11 MED ORDER — INFLUENZA VAC SPLIT QUAD 0.5 ML IM SUSY
0.5000 mL | PREFILLED_SYRINGE | INTRAMUSCULAR | Status: DC | PRN
  Filled 2019-02-11: qty 0.5

## 2019-02-11 MED ORDER — PNEUMOCOCCAL 13-VAL CONJ VACC IM SUSP
0.5000 mL | INTRAMUSCULAR | Status: AC
  Administered 2019-02-12: 0.5 mL via INTRAMUSCULAR
  Filled 2019-02-11: qty 0.5

## 2019-02-11 NOTE — Progress Notes (Signed)
Progress Note  Patient Name: Cory Berry Date of Encounter: 02/11/2019  Primary Cardiologist: No primary care provider on file.   Subjective   No chest pain or sob.   Inpatient Medications    Scheduled Meds: . aspirin  81 mg Oral Daily  . atorvastatin  80 mg Oral q1800  . Chlorhexidine Gluconate Cloth  6 each Topical Daily  . metoprolol tartrate  12.5 mg Oral BID  . sodium chloride flush  3 mL Intravenous Once  . sodium chloride flush  3 mL Intravenous Q12H  . ticagrelor  90 mg Oral BID   Continuous Infusions: . sodium chloride 100 mL/hr at 02/10/19 2200  . sodium chloride     PRN Meds: sodium chloride, acetaminophen, albuterol, nitroGLYCERIN, ondansetron (ZOFRAN) IV, sodium chloride flush   Vital Signs    Vitals:   02/11/19 0737 02/11/19 0800 02/11/19 0900 02/11/19 1000  BP:  115/79 124/84 113/84  Pulse:  73    Resp:  10 17 (!) 26  Temp: 97.9 F (36.6 C)     TempSrc: Oral     SpO2:  98%    Weight:      Height:        Intake/Output Summary (Last 24 hours) at 02/11/2019 1007 Last data filed at 02/11/2019 1000 Gross per 24 hour  Intake 2437.49 ml  Output 1100 ml  Net 1337.49 ml   Filed Weights   02/10/19 0412  Weight: 90.7 kg    Telemetry    nsr - Personally Reviewed  ECG    none - Personally Reviewed  Physical Exam   GEN: No acute distress.   Neck: No JVD Cardiac: RRR, no murmurs, rubs, or gallops.  Respiratory: Clear to auscultation bilaterally. GI: Soft, nontender, non-distended  MS: No edema; No deformity. Neuro:  Nonfocal  Psych: Normal affect   Labs    Chemistry Recent Labs  Lab 02/10/19 0357 02/11/19 0237  NA 141 137  K 4.3 3.9  CL 106 106  CO2 24 22  GLUCOSE 96 102*  BUN 19 14  CREATININE 1.18 1.12  CALCIUM 9.5 8.8*  GFRNONAA >60 >60  GFRAA >60 >60  ANIONGAP 11 9     Hematology Recent Labs  Lab 02/10/19 0357 02/11/19 0237  WBC 7.0 9.7  RBC 4.64 4.18*  HGB 15.3 13.4  HCT 44.9 40.6  MCV 96.8 97.1  MCH 33.0  32.1  MCHC 34.1 33.0  RDW 12.6 12.7  PLT 226 194    Cardiac EnzymesNo results for input(s): TROPONINI in the last 168 hours. No results for input(s): TROPIPOC in the last 168 hours.   BNPNo results for input(s): BNP, PROBNP in the last 168 hours.   DDimer No results for input(s): DDIMER in the last 168 hours.   Radiology    Dg Chest Port 1 View  Result Date: 02/10/2019 CLINICAL DATA:  C/o chest pressure to center of chest that radiates up into neck that woke him up this morning. Denies SOB. Reports nausea and vomiting this AM. States this has happened every other night x 3. States today is first day with nausea and vomiting. EKG on arrival shows STEMI. Pt straight to room after EKG EXAM: PORTABLE CHEST 1 VIEW COMPARISON:  None. FINDINGS: Cardiac silhouette is normal in size. Normal mediastinal and hilar contours. Clear lungs.  No pleural effusion or pneumothorax. Skeletal structures are grossly intact. IMPRESSION: No active disease. Electronically Signed   By: Amie Portlandavid  Ormond M.D.   On: 02/10/2019 04:15  Cardiac Studies   none  Patient Profile     58 y.o. male admitted with anterolateral STEMI, s/p PCI/stent of LAD  Assessment & Plan    1. Anterolateral STEMI - he is s/p PCI and is doing well. We discussed pathophysiology of MI and need for life style changes.  2. Dyslipidemia - he has been started on atorvastatin.  3. Tobacco abuse - he states he will stop smoking.  4. Disp. - we will transfer to tele and plan for DC home tomorrow if stable.   For questions or updates, please contact [facility] Please consult [email or web address] for contact info under Cardiology/STEMI.      Signed, Cristopher Peru, MD  02/11/2019, 10:07 AM  Patient ID: Cory Berry, male   DOB: 1961/04/05, 58 y.o.   MRN: [id number]

## 2019-02-11 NOTE — Progress Notes (Signed)
Stable assessment, denies pain. Up independently, TR site WNL. Ambulated to [facility] with this RN. Report given to [facility]

## 2019-02-12 ENCOUNTER — Encounter (HOSPITAL_COMMUNITY): Payer: Self-pay | Admitting: Cardiovascular Disease

## 2019-02-12 DIAGNOSIS — I5021 Acute systolic (congestive) heart failure: Secondary | ICD-10-CM

## 2019-02-12 DIAGNOSIS — E782 Mixed hyperlipidemia: Secondary | ICD-10-CM

## 2019-02-12 DIAGNOSIS — I10 Essential (primary) hypertension: Secondary | ICD-10-CM

## 2019-02-12 DIAGNOSIS — E785 Hyperlipidemia, unspecified: Secondary | ICD-10-CM

## 2019-02-12 LAB — POCT I-STAT, CHEM 8
BUN: 18 mg/dL (ref 6–20)
Calcium, Ion: 1.23 mmol/L (ref 1.15–1.40)
Chloride: 101 mmol/L (ref 98–111)
Creatinine, Ser: 0.9 mg/dL (ref 0.61–1.24)
Glucose, Bld: 118 mg/dL — ABNORMAL HIGH (ref 70–99)
HCT: 39 % (ref 39.0–52.0)
Hemoglobin: 13.3 g/dL (ref 13.0–17.0)
Potassium: 4 mmol/L (ref 3.5–5.1)
Sodium: 137 mmol/L (ref 135–145)
TCO2: 24 mmol/L (ref 22–32)

## 2019-02-12 LAB — POCT ACTIVATED CLOTTING TIME: Activated Clotting Time: 566 seconds

## 2019-02-12 MED ORDER — METOPROLOL SUCCINATE ER 25 MG PO TB24
25.0000 mg | ORAL_TABLET | Freq: Every day | ORAL | Status: DC
  Administered 2019-02-12: 10:00:00 25 mg via ORAL
  Filled 2019-02-12: qty 1

## 2019-02-12 MED ORDER — TICAGRELOR 90 MG PO TABS: 90.0000 mg | ORAL_TABLET | Freq: Two times a day (BID) | ORAL | 12 refills | Status: DC

## 2019-02-12 MED ORDER — ATORVASTATIN CALCIUM 80 MG PO TABS: 80.0000 mg | ORAL_TABLET | Freq: Every day | ORAL | 3 refills | Status: DC

## 2019-02-12 MED ORDER — ASPIRIN 81 MG PO CHEW: 81.0000 mg | CHEWABLE_TABLET | Freq: Every day | ORAL | 12 refills | Status: AC

## 2019-02-12 MED ORDER — METOPROLOL SUCCINATE ER 25 MG PO TB24: 25.0000 mg | ORAL_TABLET | Freq: Every day | ORAL | 3 refills | Status: DC

## 2019-02-12 MED ORDER — INFLUENZA VAC SPLIT QUAD 0.5 ML IM SUSY
0.5000 mL | PREFILLED_SYRINGE | Freq: Once | INTRAMUSCULAR | Status: AC
  Administered 2019-02-12: 11:00:00 0.5 mL via INTRAMUSCULAR
  Filled 2019-02-12: qty 0.5

## 2019-02-12 MED ORDER — NITROGLYCERIN 0.4 MG SL SUBL: 0.4000 mg | SUBLINGUAL_TABLET | SUBLINGUAL | 12 refills | Status: DC | PRN

## 2019-02-12 MED FILL — METOPROLOL SUCCINATE ER 25: 25 | 30 days supply | Qty: 30 | Fill #0

## 2019-02-12 MED FILL — ATORVASTATIN CALCIUM 80 MG: 80 | 30 days supply | Qty: 30 | Fill #0

## 2019-02-12 MED FILL — ASPIRIN LOW DOSE 81 MG CHEW: 81 | 30 days supply | Qty: 30 | Fill #0

## 2019-02-12 MED FILL — NITROGLYCERIN 0.4 MG TAB SL: 0.4 | 7 days supply | Qty: 25 | Fill #0

## 2019-02-12 MED FILL — BRILINTA 90 MG TABLET: 90 | 30 days supply | Qty: 60 | Fill #0

## 2019-02-12 MED FILL — Bivalirudin Trifluoroacetate For IV Soln 250 MG (Base Equiv): INTRAVENOUS | Qty: 250 | Status: AC

## 2019-02-12 NOTE — TOC Progression Note (Addendum)
Transition of Care [facility]) - Progression Note    Patient Details  Name: Cory Berry MRN: [id number] Date of Birth: [birth year]  [facility] CM/SW Contact  Zenon Mayo, RN Phone Number: 02/12/2019, 9:27 AM  Clinical Narrative:    NCM spoke with patient, the [facility] will fill his 30 day free medicine for him and bring to his room.  He told NCM to speak with his wife Ivin Booty about his insurance, information , she states he has Garment/textile technologist, NCM informed her to bring the insurance information ant give to Admissions because we do not show any insurance listed for him, she states she would.  [facility] will call this NCM back to let know how much a refill for the brilinta will be.  Patient states he will find his own PCP.               Expected Discharge Plan and Services                                                 Social Determinants of Health (SDOH) Interventions    Readmission Risk Interventions No flowsheet data found.

## 2019-02-12 NOTE — Progress Notes (Signed)
CARDIAC REHAB PHASE I   PRE:  Rate/Rhythm: 72 SR    BP: sitting 102/69    SaO2:   MODE:  Ambulation: 300 ft   POST:  Rate/Rhythm: 91 SR    BP: sitting 123/83     SaO2:   Tolerated well, no c/o. Ed reviewed. Pt is contemplating smoking cessation. Will refer to [facility]. Understands the importance of Brilinta. [place], ACSM 02/12/2019 9:30 AM

## 2019-02-12 NOTE — Discharge Summary (Addendum)
Discharge Summary    Patient ID: Cory Berry,  MRN: [id number], DOB/AGE: 1960-12-25 58 y.o.  Admit date: 02/10/2019 Discharge date: 02/12/2019  Primary Care Provider: No primary care provider on file. Primary Cardiologist: No primary care provider on file.  Discharge Diagnoses    Principal Problem:   STEMI (ST elevation myocardial infarction) (HCC) Active Problems:   Asthma   Acute ST elevation myocardial infarction (STEMI) involving left anterior descending (LAD) coronary artery (HCC)   Hypertension   Hyperlipidemia   Allergies No Known Allergies  Diagnostic Studies/Procedures    Cath: 02/10/19    Prox LAD to Mid LAD lesion is 100% stenosed.  1st Mrg lesion is 40% stenosed.  Post intervention, there is a 0% residual stenosis.  A stent was successfully placed.   Acute lateral/anterolateral ST segment elevation myocardial infarction secondary to total proximal occlusion of the LAD.  Mild concomitant CAD with 40% ostial narrowing of the OM1 branch of the circumflex vessel and vessel tortuosity of a dominant RCA with mild luminal irregularity.  LVEDP 21 mmHg.  Successful emergent PCI to the LAD with ultimate insertion of a 3.0 x 32 mm Synergy stent postdilated to 3.3 to 3.25 mm taper with 200% occlusion being reduced to 0% and TIMI 0 flow improving to TIMI-3 flow.  There was no evidence for dissection.  RECOMMENDATION; DAPT for minimum of 12 months with class I ACS recommendation.  Will initiate low-dose beta-blocker high potency statin therapy.  A 2D echo Doppler study will be obtained for further assessment of LV function.  Smoking cessation is imperative.  Since the patient was awakened from sleep in the early morning consider evaluation for obstructive sleep apnea attributing to nocturnal hypoxemia particularly during REM sleep.  Diagnostic Dominance: Right  Intervention    TTE: 02/10/19  IMPRESSIONS    1. Left ventricular ejection fraction, by  visual estimation, is 35 to 40%. The left ventricle has moderately decreased function. Normal left ventricular size. There is mildly increased left ventricular hypertrophy.  2. Multiple segmental abnormalities exist. See findings.  3. The average left ventricular global longitudinal strain is -9.8 %.  4. Definity contrast agent was given IV to delineate the left ventricular endocardial borders. No LV mural thrombus visualized.  5. Left ventricular diastolic Doppler parameters are consistent with impaired relaxation pattern of LV diastolic filling.  6. Global right ventricle has normal systolic function.The right ventricular size is normal. No increase in right ventricular wall thickness.  7. Left atrial size was normal.  8. Right atrial size was normal.  9. The mitral valve is grossly normal. Trace mitral valve regurgitation. 10. The tricuspid valve is grossly normal. Tricuspid valve regurgitation is trivial. 11. The aortic valve is tricuspid. Aortic valve regurgitation was not visualized by color flow Doppler. 12. The pulmonic valve was grossly normal. Pulmonic valve regurgitation is not visualized by color flow Doppler. 13. Normal pulmonary artery systolic pressure. 14. The inferior vena cava is normal in size with greater than 50% respiratory variability, suggesting right atrial pressure of 3 mmHg.  _____________   History of Present Illness     Cory Berry is a 58 yo male with PMH of tobacco use and asthma who was in his usual state of health until the past 6 days prior to admission when he started to develop intermittent chest pain at night. He woke up from sleep about 6 nights prior with chest pain. He called EMS who evaluated him and noted his vitals and ECG to be  normal. His chest pain resolved. He did not have chest pain the following night but then did the third night. His pain was similar that time and he took some nexium and went back to bed.  The evening prior to admission, he did not  have chest pain again. The night of admission, he woke up from sleep with the same chest pain that he described as a squeezing discomfort in the center of his chest. This time was worse and associated with nausea, vomiting, and diaphoresis. He and his wife drove to the ER given his symptoms. His ECG was remarkable for ST elevation in V5 and V6 with reciprocal changes. He was given 3 SL nitro tablets with improvement in his pain to 3/10 but persistent discomfort.  He smokes 2+ppd and had for about the past 12 years. He has 2-4 beers each night. He denied any drug use. 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 He typically walks multiple miles in a day and denied having any chest pain or shortness of breath with exertion. His parents and two siblings are all alive and healthy. He has no family history of coronary disease. Of note, he had not been to see a doctor since he moved to the [place] 12 years ago.  Hospital Course     Underwent cardiac cath noted above with 100% occlusion of the p/mLAD with successful PCI/DES x1. TIMI 3 flow post procedure. Placed on DAPT with ASA/Brilinta post cath for at least one year. Low dose metoprolol was started as well as statin. Troponin peaked >27000. LDL 70, Trig 316. Follow up echo showed EF of 35-40% with multiple areas of WMA. No LV thrombus noted. Worked well with cardiac rehab without recurrent chest pain. Tobacco cessation advised.   General: Well developed, well nourished, male appearing in no acute distress. Head: Normocephalic, atraumatic.  Neck: Supple without bruits, JVD. Lungs:  Resp regular and unlabored, CTA. Heart: RRR, S1, S2, no murmur; no rub. Abdomen: Soft, non-tender, non-distended with normoactive bowel sounds.s. Extremities: No clubbing, cyanosis, edema. Distal pedal pulses are 2+ bilaterally. Right radial cath site stable without bruising or hematoma Neuro: Alert and oriented X 3. Moves all extremities spontaneously.  Psych: Normal affect.  [name] was seen by Dr. Eldridge Dace and determined stable for discharge home. Follow up in the office has been arranged. Medications are listed below.   _____________  Discharge Vitals Blood pressure 102/69, pulse 74, temperature 98 F (36.7 C), temperature source Oral, resp. rate 13, height 5\' 7"  (1.702 m), weight 90.7 kg, SpO2 100 %.  Filed Weights   02/10/19 0412  Weight: 90.7 kg    Labs & Radiologic Studies    CBC Recent Labs    02/10/19 0357 02/11/19 0237  WBC 7.0 9.7  HGB 15.3 13.4  HCT 44.9 40.6  MCV 96.8 97.1  PLT 226 194   Basic Metabolic Panel Recent Labs    71/21/97 0357 02/11/19 0237  NA 141 137  K 4.3 3.9  CL 106 106  CO2 24 22  GLUCOSE 96 102*  BUN 19 14  CREATININE 1.18 1.12  CALCIUM 9.5 8.8*   Liver Function Tests No results for input(s): AST, ALT, ALKPHOS, BILITOT, PROT, ALBUMIN in the last 72 hours. No results for input(s): LIPASE, AMYLASE in the last 72 hours. Cardiac Enzymes No results for input(s): CKTOTAL, CKMB, CKMBINDEX, TROPONINI in the last 72 hours. BNP Invalid input(s): POCBNP D-Dimer No results for input(s): DDIMER in the last 72  hours. Hemoglobin A1C No results for input(s): HGBA1C in the last 72 hours. Fasting Lipid Panel Recent Labs    02/10/19 0358  CHOL 181  HDL 48  LDLCALC 70  TRIG 316*  CHOLHDL 3.8   Thyroid Function Tests No results for input(s): TSH, T4TOTAL, T3FREE, THYROIDAB in the last 72 hours.  Invalid input(s): FREET3 _____________  Dg Chest Port 1 View  Result Date: 02/10/2019 CLINICAL DATA:  C/o chest pressure to center of chest that radiates up into neck that woke him up this morning. Denies SOB. Reports nausea and vomiting this AM. States this has happened every other night x 3. States today is first day with nausea and vomiting. EKG on arrival shows STEMI. Pt straight to room after EKG EXAM: PORTABLE CHEST 1 VIEW COMPARISON:  None. FINDINGS: Cardiac silhouette is normal in  size. Normal mediastinal and hilar contours. Clear lungs.  No pleural effusion or pneumothorax. Skeletal structures are grossly intact. IMPRESSION: No active disease. Electronically Signed   By: Lajean Manes M.D.   On: 02/10/2019 04:15   Disposition   Pt is being discharged home today in good condition.  Follow-up Plans & Appointments    Follow-up Information    [facility].   Contact information: [place] [id number] [phone]       [facility], [place] Follow up on 02/23/2019.   Specialties: Cardiology, Radiology Why: at 1:45pm for your follow up appt.  Contact information: [address] [place] [address] [phone]          Discharge Instructions    Amb Referral to Cardiac Rehabilitation   Complete by: As directed    Diagnosis:  Coronary Stents STEMI     After initial evaluation and assessments completed: Virtual Based Care may be provided alone or in conjunction with Phase 2 Cardiac Rehab based on patient barriers.: Yes   Diet - low sodium heart healthy   Complete by: As directed    Discharge instructions   Complete by: As directed    Radial Site Care Refer to this sheet in the next few weeks. These instructions provide you with information on caring for yourself after your procedure. Your caregiver may also give you more specific instructions. Your treatment has been planned according to current medical practices, but problems sometimes occur. Call your caregiver if you have any problems or questions after your procedure. HOME CARE INSTRUCTIONS You may shower the day after the procedure.Remove the bandage (dressing) and gently wash the site with plain soap and water.Gently pat the site dry.  Do not apply powder or lotion to the site.  Do not submerge the affected site in water for 3 to 5 days.  Inspect the site at least twice daily.  Do not flex or bend the affected arm  for 24 hours.  No lifting over 5 pounds (2.3 kg) for 5 days after your procedure.  What to expect: Any bruising will usually fade within 1 to 2 weeks.  Blood that collects in the tissue (hematoma) may be painful to the touch. It should usually decrease in size and tenderness within 1 to 2 weeks.  SEEK IMMEDIATE MEDICAL CARE IF: You have unusual pain at the radial site.  You have redness, warmth, swelling, or pain at the radial site.  You have drainage (other than a small amount of blood on the dressing).  You have chills.  You have a fever or persistent symptoms for  more than 72 hours.  You have a fever and your symptoms suddenly get worse.  Your arm becomes pale, cool, tingly, or numb.  You have heavy bleeding from the site. Hold pressure on the site.   PLEASE DO NOT MISS ANY DOSES OF YOUR BRILINTA!!!!! Also keep a log of you blood pressures and bring back to your follow up appt. Please call the office with any questions.   Patients taking blood thinners should generally stay away from medicines like ibuprofen, Advil, Motrin, naproxen, and Aleve due to risk of stomach bleeding. You may take Tylenol as directed or talk to your primary doctor about alternatives.  No driving for 2 weeks. No lifting over 10 lbs for 4 weeks. No sexual activity for 4 weeks. You may not return to work until cleared by your cardiologist. Keep procedure site clean & dry. If you notice increased pain, swelling, bleeding or pus, call/return!  You may shower, but no soaking baths/hot tubs/pools for 1 week.   Increase activity slowly   Complete by: As directed        Discharge Medications     Medication List    TAKE these medications   albuterol 108 (90 Base) MCG/ACT inhaler Commonly known as: VENTOLIN HFA Inhale 1-2 puffs into the lungs every 6 (six) hours as needed for wheezing or shortness of breath.   aspirin 81 MG chewable tablet Chew 1 tablet (81 mg total) by mouth daily.   atorvastatin 80 MG tablet  Commonly known as: LIPITOR Take 1 tablet (80 mg total) by mouth daily at 6 PM.   metoprolol succinate 25 MG 24 hr tablet Commonly known as: TOPROL-XL Take 1 tablet (25 mg total) by mouth daily.   nitroGLYCERIN 0.4 MG SL tablet Commonly known as: NITROSTAT Place 1 tablet (0.4 mg total) under the tongue every 5 (five) minutes x 3 doses as needed for chest pain.   ticagrelor 90 MG Tabs tablet Commonly known as: BRILINTA Take 1 tablet (90 mg total) by mouth 2 (two) times daily.        Acute coronary syndrome (MI, NSTEMI, STEMI, etc) this admission?: Yes.     AHA/ACC Clinical Performance & Quality Measures: 1. Aspirin prescribed? - Yes 2. ADP Receptor Inhibitor (Plavix/Clopidogrel, Brilinta/Ticagrelor or Effient/Prasugrel) prescribed (includes medically managed patients)? - Yes 3. Beta Blocker prescribed? - Yes 4. High Intensity Statin (Lipitor 40-80mg  or Crestor 20-40mg ) prescribed? - Yes 5. EF assessed during THIS hospitalization? - Yes 6. For EF <40%, was ACEI/ARB prescribed? - No - Reason:  blood pressures soft. consider as outpatient 7. For EF <40%, Aldosterone Antagonist (Spironolactone or Eplerenone) prescribed? - No - Reason:  blood pressures soft. consider as outpatient 8. Cardiac Rehab Phase II ordered (Included Medically managed Patients)? - Yes      Outstanding Labs/Studies   FLP/LFTs in 6 weeks. Outpatient echo in 8 weeks.   Duration of Discharge Encounter   Greater than 30 minutes including physician time.  Signed, Laverda Page NP-C 02/12/2019, 10:17 AM   I have examined the patient and reviewed assessment and plan and discussed with patient.  Agree with above as stated.    GEN: Well nourished, well developed, in no acute distress  HEENT: normal  Neck: no JVD, carotid bruits, or masses Cardiac: RRR; no murmurs, rubs, or gallops,no edema  Respiratory:  clear to auscultation bilaterally, normal work of breathing GI: soft, nontender, nondistended,  MS: no  deformity or atrophy ; no hematoma Skin: warm and dry, no rash Neuro:  Strength  and sensation are intact Psych: euthymic mood, full affect  OK for discharge/  I stressed the importance of DAPT with Brilinta.    Borderline BP does not allow for ACE-I at this time. Would reassess at his outpatient visit.  Hopefully,. EF will recover with revascularization.    Lance MussJayadeep Tiaja Hagan

## 2019-02-23 ENCOUNTER — Other Ambulatory Visit: Payer: Self-pay

## 2019-02-23 ENCOUNTER — Encounter: Payer: Self-pay | Admitting: Physician Assistant

## 2019-02-23 ENCOUNTER — Ambulatory Visit (INDEPENDENT_AMBULATORY_CARE_PROVIDER_SITE_OTHER): Payer: Managed Care, Other (non HMO) | Admitting: Physician Assistant

## 2019-02-23 VITALS — BP 130/78 | HR 80 | Temp 98.8°F | Ht 67.0 in | Wt 191.4 lb

## 2019-02-23 DIAGNOSIS — I255 Ischemic cardiomyopathy: Secondary | ICD-10-CM

## 2019-02-23 DIAGNOSIS — I2510 Atherosclerotic heart disease of native coronary artery without angina pectoris: Secondary | ICD-10-CM | POA: Diagnosis not present

## 2019-02-23 DIAGNOSIS — I519 Heart disease, unspecified: Secondary | ICD-10-CM | POA: Diagnosis not present

## 2019-02-23 DIAGNOSIS — E782 Mixed hyperlipidemia: Secondary | ICD-10-CM | POA: Diagnosis not present

## 2019-02-23 DIAGNOSIS — Z79899 Other long term (current) drug therapy: Secondary | ICD-10-CM | POA: Diagnosis not present

## 2019-02-23 MED ORDER — LOSARTAN POTASSIUM 25 MG PO TABS: 25.0000 mg | ORAL_TABLET | Freq: Every day | ORAL | 1 refills | Status: DC

## 2019-02-23 NOTE — Progress Notes (Signed)
Cardiology Office Note    Date:  02/25/2019   ID:  Cory Berry, DOB 09/05/1960, MRN [id number]  PCP:  Patient, No Pcp Per  Cardiologist:  Dr. Tresa Endo  Chief Complaint  Patient presents with  . Follow-up    seen for Dr. Tresa Endo. Post MI    History of Present Illness:  Cory Berry is a 58 y.o. male with significant past medical history of tobacco abuse who recently presented to the hospital in the morning of 02/10/2019 with intermittent chest pain.  Initially EKG demonstrated ST elevation in V5 and V6 with reciprocal changes.  Cardiac catheterization performed on 02/10/2019 showed 40% OM1 lesion, 100% proximal to mid LAD occlusion treated with a 3.0 x 32 mm Synergy DES.  There is also some degree of suspicion for obstructive sleep apnea contributing to nocturnal hypoxemia.  Postprocedure, patient was placed on aspirin and Brilinta.  Troponin was elevated to 27,000.  Echocardiogram obtained on 02/10/2019 showed EF 35 to 40% no LV thrombus, mid and apical anterior septum, mid and apical inferior septum, apical lateral segment, apical inferior segment and apex were akinetic.  Lipid panel obtained during this hospitalization showed LDL 70, HDL 48, total cholesterol 409, triglyceride 216.  Patient presents today for cardiology office visit.  He has not had any further chest pain since he left the hospital.  He has been compliant with aspirin and Brilinta.  I added losartan 25 mg daily on top of the Toprol-XL.  He will need a 69-month echocardiogram to evaluate ejection fraction.  Given that additional losartan, he will need a fasting lipid panel and CMP in 2 weeks to check both cholesterol, liver function and potassium level.  He does not have any lower extremity edema, orthopnea or PND.  He can follow-up in 3 months.   Past Medical History:  Diagnosis Date  . Asthma     Past Surgical History:  Procedure Laterality Date  . CORONARY STENT INTERVENTION N/A 02/10/2019   Procedure: CORONARY STENT  INTERVENTION;  Surgeon: Lennette Bihari, MD;  Location: [facility] INVASIVE CV LAB;  Service: Cardiovascular;  Laterality: N/A;  . CORONARY/GRAFT ACUTE MI REVASCULARIZATION N/A 02/10/2019   Procedure: Coronary/Graft Acute MI Revascularization;  Surgeon: Lennette Bihari, MD;  Location: [facility] INVASIVE CV LAB;  Service: Cardiovascular;  Laterality: N/A;  . LEFT HEART CATH AND CORONARY ANGIOGRAPHY N/A 02/10/2019   Procedure: LEFT HEART CATH AND CORONARY ANGIOGRAPHY;  Surgeon: Lennette Bihari, MD;  Location: [facility] INVASIVE CV LAB;  Service: Cardiovascular;  Laterality: N/A;  . NECK SURGERY      Current Medications: Outpatient Medications Prior to Visit  Medication Sig Dispense Refill  . albuterol (VENTOLIN HFA) 108 (90 Base) MCG/ACT inhaler Inhale 1-2 puffs into the lungs every 6 (six) hours as needed for wheezing or shortness of breath.    Marland Kitchen aspirin 81 MG chewable tablet Chew 1 tablet (81 mg total) by mouth daily. 30 tablet 12  . atorvastatin (LIPITOR) 80 MG tablet Take 1 tablet (80 mg total) by mouth daily at 6 PM. 30 tablet 3  . metoprolol succinate (TOPROL-XL) 25 MG 24 hr tablet Take 1 tablet (25 mg total) by mouth daily. 30 tablet 3  . nitroGLYCERIN (NITROSTAT) 0.4 MG SL tablet Place 1 tablet (0.4 mg total) under the tongue every 5 (five) minutes x 3 doses as needed for chest pain. 25 tablet 12  . ticagrelor (BRILINTA) 90 MG TABS tablet Take 1 tablet (90 mg total) by mouth 2 (two) times daily. 60 tablet 12  No facility-administered medications prior to visit.      Allergies:   Patient has no known allergies.   Social History   Socioeconomic History  . Marital status: Married    Spouse name: Not on file  . Number of children: Not on file  . Years of education: Not on file  . Highest education level: Not on file  Occupational History  . Not on file  Social Needs  . Financial resource strain: Not on file  . Food insecurity    Worry: Not on file    Inability: Not on file  . Transportation needs     Medical: Not on file    Non-medical: Not on file  Tobacco Use  . Smoking status: Current Every Day Smoker  . Smokeless tobacco: Never Used  Substance and Sexual Activity  . Alcohol use: Yes  . Drug use: Never  . Sexual activity: Not on file  Lifestyle  . Physical activity    Days per week: Not on file    Minutes per session: Not on file  . Stress: Not on file  Relationships  . Social Musician on phone: Not on file    Gets together: Not on file    Attends religious service: Not on file    Active member of club or organization: Not on file    Attends meetings of clubs or organizations: Not on file    Relationship status: Not on file  Other Topics Concern  . Not on file  Social History Narrative  . Not on file     Family History:  The patient's family history is not on file.   ROS:   Please see the history of present illness.    ROS All other systems reviewed and are negative.   PHYSICAL EXAM:   VS:  BP 130/78   Pulse 80   Temp 98.8 F (37.1 C)   Ht 5\' 7"  (1.702 m)   Wt 191 lb 6.4 oz (86.8 kg)   BMI 29.98 kg/m    GEN: Well nourished, well developed, in no acute distress  HEENT: normal  Neck: no JVD, carotid bruits, or masses Cardiac: RRR; no murmurs, rubs, or gallops,no edema  Respiratory:  clear to auscultation bilaterally, normal work of breathing GI: soft, nontender, nondistended, + BS MS: no deformity or atrophy  Skin: warm and dry, no rash Neuro:  Alert and Oriented x 3, Strength and sensation are intact Psych: euthymic mood, full affect  Wt Readings from Last 3 Encounters:  02/23/19 191 lb 6.4 oz (86.8 kg)  02/10/19 200 lb (90.7 kg)      Studies/Labs Reviewed:   EKG:  EKG is ordered today.  The ekg ordered today demonstrates normal sinus rhythm, Q waves in anterior leads, significant T wave inversion in lateral leads.  Recent Labs: 02/11/2019: BUN 14; Creatinine, Ser 1.12; Hemoglobin 13.4; Platelets 194; Potassium 3.9; Sodium 137    Lipid Panel    Component Value Date/Time   CHOL 181 02/10/2019 0358   TRIG 316 (H) 02/10/2019 0358   HDL 48 02/10/2019 0358   CHOLHDL 3.8 02/10/2019 0358   VLDL 63 (H) 02/10/2019 0358   LDLCALC 70 02/10/2019 0358    Additional studies/ records that were reviewed today include:   Cath 02/10/2019  Prox LAD to Mid LAD lesion is 100% stenosed.  1st Mrg lesion is 40% stenosed.  Post intervention, there is a 0% residual stenosis.  A stent was successfully placed.  Acute lateral/anterolateral ST segment elevation myocardial infarction secondary to total proximal occlusion of the LAD.  Mild concomitant CAD with 40% ostial narrowing of the OM1 branch of the circumflex vessel and vessel tortuosity of a dominant RCA with mild luminal irregularity.  LVEDP 21 mmHg.  Successful emergent PCI to the LAD with ultimate insertion of a 3.0 x 32 mm Synergy stent postdilated to 3.3 to 3.25 mm taper with 200% occlusion being reduced to 0% and TIMI 0 flow improving to TIMI-3 flow.  There was no evidence for dissection.  RECOMMENDATION; DAPT for minimum of 12 months with class I ACS recommendation.  Will initiate low-dose beta-blocker high potency statin therapy.  A 2D echo Doppler study will be obtained for further assessment of LV function.  Smoking cessation is imperative.  Since the patient was awakened from sleep in the early morning consider evaluation for obstructive sleep apnea attributing to nocturnal hypoxemia particularly during REM sleep.   Echo 02/10/2019 IMPRESSIONS    1. Left ventricular ejection fraction, by visual estimation, is 35 to 40%. The left ventricle has moderately decreased function. Normal left ventricular size. There is mildly increased left ventricular hypertrophy.  2. Multiple segmental abnormalities exist. See findings.  3. The average left ventricular global longitudinal strain is -9.8 %.  4. Definity contrast agent was given IV to delineate the left ventricular  endocardial borders. No LV mural thrombus visualized.  5. Left ventricular diastolic Doppler parameters are consistent with impaired relaxation pattern of LV diastolic filling.  6. Global right ventricle has normal systolic function.The right ventricular size is normal. No increase in right ventricular wall thickness.  7. Left atrial size was normal.  8. Right atrial size was normal.  9. The mitral valve is grossly normal. Trace mitral valve regurgitation. 10. The tricuspid valve is grossly normal. Tricuspid valve regurgitation is trivial. 11. The aortic valve is tricuspid. Aortic valve regurgitation was not visualized by color flow Doppler. 12. The pulmonic valve was grossly normal. Pulmonic valve regurgitation is not visualized by color flow Doppler. 13. Normal pulmonary artery systolic pressure. 14. The inferior vena cava is normal in size with greater than 50% respiratory variability, suggesting right atrial pressure of 3 mmHg.  FINDINGS  Left Ventricle: Left ventricular ejection fraction, by visual estimation, is 35 to 40%. The left ventricle has moderately decreased function. Definity contrast agent was given IV to delineate the left ventricular endocardial borders. The average left  ventricular global longitudinal strain is -9.8 %. There is mildly increased left ventricular hypertrophy. Normal left ventricular size. Spectral Doppler shows Left ventricular diastolic Doppler parameters are consistent with impaired relaxation pattern  of LV diastolic filling.     ASSESSMENT:    1. Coronary artery disease involving native coronary artery of native heart without angina pectoris   2. Mixed hyperlipidemia   3. Medication management   4. LV dysfunction   5. Ischemic cardiomyopathy      PLAN:  In order of problems listed above:  1. CAD: Underwent DES to LAD on 02/10/2019.  Continue aspirin and Brilinta.  Denies any further chest discomfort  2. Ischemic cardiomyopathy: Baseline EF 35  to 40%.  Continue Toprol-XL.  Add losartan 25 mg daily.  Obtain echocardiogram in 2 months.  3. Hyperlipidemia: Continue Lipitor 80 mg daily.  Obtain fasting lipid panel and a CMP in 2 weeks.   Medication Adjustments/Labs and Tests Ordered: Current medicines are reviewed at length with the patient today.  Concerns regarding medicines are outlined above.  Medication changes,  Labs and Tests ordered today are listed in the Patient Instructions below. Patient Instructions  Medication Instructions:   START Losartan 25 mg daily  If you need a refill on your cardiac medications before your next appointment, please call your pharmacy.   Lab work: You will need to have labs (blood work) drawn in 2 weeks:   Fasting Lipid Panel-DO NOT EAT OR DRINK PAST MIDNIGHT  CMP If you have labs (blood work) drawn today and your tests are completely normal, you will receive your results only by: Marland Kitchen MyChart Message (if you have MyChart) OR . A paper copy in the mail If you have any lab test that is abnormal or we need to change your treatment, we will call you to review the results.  Testing/Procedures: Your physician has requested that you have an echocardiogram. Echocardiography is a painless test that uses sound waves to create images of your heart. It provides your doctor with information about the size and shape of your heart and how well your heart's chambers and valves are working. This procedure takes approximately one hour. There are no restrictions for this procedure.  Please schedule repeat echocardiogram to be done in 3 months   Follow-Up: At [facility], you and your health needs are our priority.  As part of our continuing mission to provide you with exceptional heart care, we have created designated Provider Care Teams.  These Care Teams include your primary Cardiologist (physician) and Advanced Practice Providers (APPs -  Physician Assistants and Nurse Practitioners) who all work together to  provide you with the care you need, when you need it. You will need a follow up appointment in 3 months with Shelva Majestic, MD after your echocardiogram  Any Other Special Instructions Will Be Listed Below (If Applicable).       Hilbert Corrigan, [place]  02/25/2019 11:19 AM    [address], [place], [place]  [address] Phone: [phone]; Fax: [phone]

## 2019-02-23 NOTE — Patient Instructions (Addendum)
Medication Instructions:   START Losartan 25 mg daily  If you need a refill on your cardiac medications before your next appointment, please call your pharmacy.   Lab work: You will need to have labs (blood work) drawn in 2 weeks:   Fasting Lipid Panel-DO NOT EAT OR DRINK PAST MIDNIGHT  CMP If you have labs (blood work) drawn today and your tests are completely normal, you will receive your results only by: Marland Kitchen MyChart Message (if you have MyChart) OR . A paper copy in the mail If you have any lab test that is abnormal or we need to change your treatment, we will call you to review the results.  Testing/Procedures: Your physician has requested that you have an echocardiogram. Echocardiography is a painless test that uses sound waves to create images of your heart. It provides your doctor with information about the size and shape of your heart and how well your heart's chambers and valves are working. This procedure takes approximately one hour. There are no restrictions for this procedure.  Please schedule repeat echocardiogram to be done in 3 months   Follow-Up: At [facility], you and your health needs are our priority.  As part of our continuing mission to provide you with exceptional heart care, we have created designated Provider Care Teams.  These Care Teams include your primary Cardiologist (physician) and Advanced Practice Providers (APPs -  Physician Assistants and Nurse Practitioners) who all work together to provide you with the care you need, when you need it. You will need a follow up appointment in 3 months with Cory Majestic, MD after your echocardiogram  Any Other Special Instructions Will Be Listed Below (If Applicable).

## 2019-02-23 NOTE — Progress Notes (Signed)
   Primary Cardiologist: Shelva Majestic, MD  Mr. Cory Berry (DOB April 23, 1961) was admitted at [facility] from 9/19 until 02/12/2019. He was seen in the cardiology office today at which time he is recovering well. He may return to work starting on 03/05/2019  Thank you for your understanding  Please call us for any questions at [place], [place] 02/23/2019, 3:07 PM

## 2019-02-25 ENCOUNTER — Encounter: Payer: Self-pay | Admitting: Physician Assistant

## 2019-03-10 LAB — COMPREHENSIVE METABOLIC PANEL
ALT: 25 IU/L (ref 0–44)
AST: 21 IU/L (ref 0–40)
Albumin/Globulin Ratio: 1.9 (ref 1.2–2.2)
Albumin: 4.4 g/dL (ref 3.8–4.9)
Alkaline Phosphatase: 86 IU/L (ref 39–117)
BUN/Creatinine Ratio: 17 (ref 9–20)
BUN: 18 mg/dL (ref 6–24)
Bilirubin Total: 0.4 mg/dL (ref 0.0–1.2)
CO2: 25 mmol/L (ref 20–29)
Calcium: 9.8 mg/dL (ref 8.7–10.2)
Chloride: 107 mmol/L — ABNORMAL HIGH (ref 96–106)
Creatinine, Ser: 1.04 mg/dL (ref 0.76–1.27)
GFR calc Af Amer: 92 mL/min/{1.73_m2} (ref >59)
GFR calc non Af Amer: 79 mL/min/{1.73_m2} (ref >59)
Globulin, Total: 2.3 g/dL (ref 1.5–4.5)
Glucose: 88 mg/dL (ref 65–99)
Potassium: 4.9 mmol/L (ref 3.5–5.2)
Sodium: 143 mmol/L (ref 134–144)
Total Protein: 6.7 g/dL (ref 6.0–8.5)

## 2019-03-10 LAB — LIPID PANEL
Chol/HDL Ratio: 1.9 ratio (ref 0.0–5.0)
Cholesterol, Total: 83 mg/dL — ABNORMAL LOW (ref 100–199)
HDL: 43 mg/dL (ref >39)
LDL Chol Calc (NIH): 27 mg/dL (ref 0–99)
Triglycerides: 55 mg/dL (ref 0–149)
VLDL Cholesterol Cal: 13 mg/dL (ref 5–40)

## 2019-03-15 ENCOUNTER — Telehealth: Payer: Self-pay

## 2019-03-15 NOTE — Telephone Encounter (Addendum)
Left a voice message for the patient to give our office a call to give results. Will also mail a letter with the results and comment from Almyra Deforest, PA-C.   ----- Message from Almyra Deforest, [place] sent at 03/14/2019  4:28 PM EDT ----- Kidney function and liver function ok. Cholesterol very well controlled

## 2019-06-04 ENCOUNTER — Ambulatory Visit (HOSPITAL_COMMUNITY): Payer: Managed Care, Other (non HMO) | Attending: Physician Assistant

## 2019-06-05 ENCOUNTER — Ambulatory Visit: Payer: Managed Care, Other (non HMO) | Admitting: Cardiovascular Disease

## 2019-06-08 ENCOUNTER — Other Ambulatory Visit (HOSPITAL_COMMUNITY): Payer: Managed Care, Other (non HMO)

## 2019-06-11 ENCOUNTER — Ambulatory Visit: Payer: Managed Care, Other (non HMO) | Admitting: Cardiovascular Disease

## 2019-06-12 ENCOUNTER — Other Ambulatory Visit: Payer: Self-pay | Admitting: Cardiovascular Disease

## 2019-06-12 ENCOUNTER — Other Ambulatory Visit: Payer: Self-pay | Admitting: Physician Assistant

## 2019-06-12 NOTE — Telephone Encounter (Signed)
New Message   *STAT* If patient is at the pharmacy, call can be transferred to refill team.   1. Which medications need to be refilled? (please list name of each medication and dose if known) atorvastatin (LIPITOR) 80 MG tablet     metoprolol succinate (TOPROL-XL) 25 MG 24 hr tablet    2. Which pharmacy/location (including street and city if local pharmacy) is medication to be sent to? [id number] - [place], [place] - [address]  3. Do they need a 30 day or 90 day supply? 30 day  Patient is out of medication

## 2019-06-12 NOTE — Telephone Encounter (Signed)
Rx(s) sent to pharmacy electronically.  

## 2019-06-13 MED ORDER — ATORVASTATIN CALCIUM 80 MG PO TABS: 80.0000 mg | ORAL_TABLET | Freq: Every day | ORAL | 3 refills | Status: DC

## 2019-06-13 MED ORDER — METOPROLOL SUCCINATE ER 25 MG PO TB24: 25.0000 mg | ORAL_TABLET | Freq: Every day | ORAL | 3 refills | Status: DC

## 2019-06-13 NOTE — Addendum Note (Signed)
Addended by: Oleta Mouse on: 06/13/2019 09:16 AM   Modules accepted: Orders

## 2019-06-21 ENCOUNTER — Other Ambulatory Visit: Payer: Self-pay

## 2019-06-21 ENCOUNTER — Encounter (INDEPENDENT_AMBULATORY_CARE_PROVIDER_SITE_OTHER): Payer: Self-pay

## 2019-06-21 ENCOUNTER — Ambulatory Visit (HOSPITAL_COMMUNITY): Payer: Managed Care, Other (non HMO) | Attending: Cardiology

## 2019-06-21 DIAGNOSIS — I77810 Thoracic aortic ectasia: Secondary | ICD-10-CM | POA: Diagnosis not present

## 2019-06-21 DIAGNOSIS — F17200 Nicotine dependence, unspecified, uncomplicated: Secondary | ICD-10-CM | POA: Diagnosis not present

## 2019-06-21 DIAGNOSIS — I071 Rheumatic tricuspid insufficiency: Secondary | ICD-10-CM | POA: Insufficient documentation

## 2019-06-21 DIAGNOSIS — I519 Heart disease, unspecified: Secondary | ICD-10-CM

## 2019-06-21 DIAGNOSIS — J45909 Unspecified asthma, uncomplicated: Secondary | ICD-10-CM | POA: Diagnosis not present

## 2019-06-21 DIAGNOSIS — Z955 Presence of coronary angioplasty implant and graft: Secondary | ICD-10-CM | POA: Insufficient documentation

## 2019-06-21 DIAGNOSIS — I255 Ischemic cardiomyopathy: Secondary | ICD-10-CM

## 2019-06-21 DIAGNOSIS — I252 Old myocardial infarction: Secondary | ICD-10-CM | POA: Diagnosis not present

## 2019-06-21 DIAGNOSIS — I2510 Atherosclerotic heart disease of native coronary artery without angina pectoris: Secondary | ICD-10-CM | POA: Diagnosis not present

## 2019-07-05 ENCOUNTER — Ambulatory Visit (INDEPENDENT_AMBULATORY_CARE_PROVIDER_SITE_OTHER): Payer: Managed Care, Other (non HMO) | Admitting: Cardiovascular Disease

## 2019-07-05 ENCOUNTER — Encounter: Payer: Self-pay | Admitting: Cardiovascular Disease

## 2019-07-05 ENCOUNTER — Other Ambulatory Visit: Payer: Self-pay

## 2019-07-05 DIAGNOSIS — Z79899 Other long term (current) drug therapy: Secondary | ICD-10-CM | POA: Diagnosis not present

## 2019-07-05 DIAGNOSIS — I255 Ischemic cardiomyopathy: Secondary | ICD-10-CM

## 2019-07-05 DIAGNOSIS — I2102 ST elevation (STEMI) myocardial infarction involving left anterior descending coronary artery: Secondary | ICD-10-CM | POA: Diagnosis not present

## 2019-07-05 DIAGNOSIS — Z720 Tobacco use: Secondary | ICD-10-CM

## 2019-07-05 DIAGNOSIS — E785 Hyperlipidemia, unspecified: Secondary | ICD-10-CM

## 2019-07-05 NOTE — Patient Instructions (Signed)
Medication Instructions:  STOP TAKING LOSARTAN SAMPLES OF ENTRESTO GIVEN-BEGIN TAKING ENTRESTO 24-26 ( 1 TABLET TWICE A DAY) *If you need a refill on your cardiac medications before your next appointment, please call your pharmacy*  Lab Work: IN 2 WEEKS NEED A PROBNP AND CMET If you have labs (blood work) drawn today and your tests are completely normal, you will receive your results only by: Marland Kitchen MyChart Message (if you have MyChart) OR . A paper copy in the mail If you have any lab test that is abnormal or we need to change your treatment, we will call you to review the results.  Follow-Up: At [facility], you and your health needs are our priority.  As part of our continuing mission to provide you with exceptional heart care, we have created designated Provider Care Teams.  These Care Teams include your primary Cardiologist (physician) and Advanced Practice Providers (APPs -  Physician Assistants and Nurse Practitioners) who all work together to provide you with the care you need, when you need it.  Your next appointment:   6-8 week(s)  The format for your next appointment:   In Person  Provider:   Nicki Guadalajara, MD  Other Instructions FOLLOW UP WITH PHARMACY ([facility]) FOR MEDICATION (ENTRESTO)  IN 2 WEEKS

## 2019-07-05 NOTE — Progress Notes (Signed)
Cardiology Office Note    Date:  07/07/2019   ID:  Cory Berry, DOB 12-03-1960, MRN [id number]  PCP:  Patient, No Pcp Per  Cardiologist:  Shelva Majestic, MD   Initial office visit with me following his myocardial infarction in September 2020  History of Present Illness:  Cory Berry is a 59 y.o. male who is originally from the [place] and has been living in the [place] for approximately 10 years.  On February 10, 2019 he developed new onset chest pain, and ECG showed ST elevation in V5 and V6 and a code STEMI was activated.  He underwent emergent cardiac catheterization and intervention by me and was found to have total occlusion of the proximal LAD.  There was 40% first marginal stenosis and he had a dominant RCA with luminal regularity.  He underwent successful emergent PCI to his LAD with ultimate insertion of a 3.0 x 32 mm Synergy DES stent postdilated to 3.3-3.25 taper with stenosis improved to 0% and resumption of TIMI-3 flow.  An in-hospital echo Doppler study on February 10, 2019 showed EF of 35 to 40%.   He was subsequently evaluated in the office on February 23, 2019 by Almyra Deforest.  At that time he was stable without recurrent chest pain.  He was on aspirin/Brilinta for DAPT.  Losartan 25 mg was added to his regimen of Toprol-XL 25 mg.  He has tolerated that well.  He has continued to be on atorvastatin 80 mg.  He was referred for follow-up echo Doppler study which was recently completed on June 21, 1999.  EF remains low at 30 to 35% and there was grade 1 diastolic dysfunction.  There were regional wall motion abnormalities.  Presently, he denies any recurrent chest pain, palpitations, PND or orthopnea.  Historically, he had started smoking as a teenager and after smoking for many years had quit smoking for over 23 years.  Unfortunately he had restarted smoking 10 years ago.  He had been smoking up to 2 packs/day and since his myocardial infarction he has significantly reduced his  smoking to less than 1 pack/day for continues to smoke.  He presents for initial office evaluation with me.   Past Medical History:  Diagnosis Date  . Asthma     Past Surgical History:  Procedure Laterality Date  . CORONARY STENT INTERVENTION N/A 02/10/2019   Procedure: CORONARY STENT INTERVENTION;  Surgeon: Troy Sine, MD;  Location: [facility];  Service: Cardiovascular;  Laterality: N/A;  . CORONARY/GRAFT ACUTE MI REVASCULARIZATION N/A 02/10/2019   Procedure: Coronary/Graft Acute MI Revascularization;  Surgeon: Troy Sine, MD;  Location: [facility];  Service: Cardiovascular;  Laterality: N/A;  . LEFT HEART CATH AND CORONARY ANGIOGRAPHY N/A 02/10/2019   Procedure: LEFT HEART CATH AND CORONARY ANGIOGRAPHY;  Surgeon: Troy Sine, MD;  Location: [facility];  Service: Cardiovascular;  Laterality: N/A;  . NECK SURGERY      Current Medications: Outpatient Medications Prior to Visit  Medication Sig Dispense Refill  . albuterol (VENTOLIN HFA) 108 (90 Base) MCG/ACT inhaler Inhale 1-2 puffs into the lungs every 6 (six) hours as needed for wheezing or shortness of breath.    Marland Kitchen aspirin 81 MG chewable tablet Chew 1 tablet (81 mg total) by mouth daily. 30 tablet 12  . atorvastatin (LIPITOR) 80 MG tablet Take 1 tablet (80 mg total) by mouth daily at 6 PM. 30 tablet 3  . metoprolol succinate (TOPROL-XL) 25 MG 24 hr  tablet Take 1 tablet (25 mg total) by mouth daily. 30 tablet 3  . nitroGLYCERIN (NITROSTAT) 0.4 MG SL tablet Place 1 tablet (0.4 mg total) under the tongue every 5 (five) minutes x 3 doses as needed for chest pain. 25 tablet 12  . ticagrelor (BRILINTA) 90 MG TABS tablet Take 1 tablet (90 mg total) by mouth 2 (two) times daily. 60 tablet 12  . losartan (COZAAR) 25 MG tablet TAKE 1 TABLET BY MOUTH EVERY DAY 90 tablet 3   No facility-administered medications prior to visit.     Allergies:   Patient has no known allergies.   Social History    Socioeconomic History  . Marital status: Married    Spouse name: Not on file  . Number of children: Not on file  . Years of education: Not on file  . Highest education level: Not on file  Occupational History  . Not on file  Tobacco Use  . Smoking status: Current Every Day Smoker  . Smokeless tobacco: Never Used  Substance and Sexual Activity  . Alcohol use: Yes  . Drug use: Never  . Sexual activity: Not on file  Other Topics Concern  . Not on file  Social History Narrative  . Not on file   Social Determinants of Health   Financial Resource Strain:   . Difficulty of Paying Living Expenses: Not on file  Food Insecurity:   . Worried About Charity fundraiser in the Last Year: Not on file  . Ran Out of Food in the Last Year: Not on file  Transportation Needs:   . Lack of Transportation (Medical): Not on file  . Lack of Transportation (Non-Medical): Not on file  Physical Activity:   . Days of Exercise per Week: Not on file  . Minutes of Exercise per Session: Not on file  Stress:   . Feeling of Stress : Not on file  Social Connections:   . Frequency of Communication with Friends and Family: Not on file  . Frequency of Social Gatherings with Friends and Family: Not on file  . Attends Religious Services: Not on file  . Active Member of Clubs or Organizations: Not on file  . Attends Archivist Meetings: Not on file  . Marital Status: Not on file    Socially he was born in the [place].  He moved to [place] approximately 10 years ago.  Over the last several years due to his job he moved to the [place] area.  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  Family History: Family history is negative for heart disease  ROS General: Negative; No fevers, chills, or night sweats;  HEENT: Negative; No changes in vision or hearing, sinus congestion, difficulty swallowing Pulmonary: Negative; No cough,  wheezing, shortness of breath, hemoptysis Cardiovascular: Negative; No chest pain, presyncope, syncope, palpitations GI: Negative; No nausea, vomiting, diarrhea, or abdominal pain GU: Negative; No dysuria, hematuria, or difficulty voiding Musculoskeletal: Negative; no myalgias, joint pain, or weakness Hematologic/Oncology: Negative; no easy bruising, bleeding Endocrine: Negative; no heat/cold intolerance; no diabetes Neuro: Negative; no changes in balance, headaches Skin: Negative; No rashes or skin lesions Psychiatric: Negative; No behavioral problems, depression Sleep: Negative; No snoring, daytime sleepiness, hypersomnolence, bruxism, restless legs, hypnogognic hallucinations, no cataplexy Other comprehensive 14 point system review is negative.   PHYSICAL EXAM:   VS:  BP 129/83   Pulse 89   Temp 98.1 F (36.7  C)   Ht _0  (1.702 m)   Wt 191 lb 6.4 oz (86.8 kg)   SpO2 100%   BMI 29.98 kg/m     Repeat blood pressure by me was 128/78; he states his blood pressure at home typically runs in the 1 15-1 20 range over 75 Wt Readings from Last 3 Encounters:  07/05/19 191 lb 6.4 oz (86.8 kg)  02/23/19 191 lb 6.4 oz (86.8 kg)  02/10/19 200 lb (90.7 kg)    General: Alert, oriented, no distress.  Skin: normal turgor, no rashes, warm and dry HEENT: Normocephalic, atraumatic. Pupils equal round and reactive to light; sclera anicteric; extraocular muscles intact;  Nose without nasal septal hypertrophy Mouth/Parynx benign; Mallinpatti scale 3 Neck: No JVD, no carotid bruits; normal carotid upstroke Lungs: clear to ausculatation and percussion; no wheezing or rales Chest wall: without tenderness to palpitation Heart: PMI not displaced, RRR, s1 s2 normal, 1/6 systolic murmur, no diastolic murmur, no rubs, gallops, thrills, or heaves Abdomen: soft, nontender; no hepatosplenomehaly, BS+; abdominal aorta nontender and not dilated by palpation. Back: no CVA tenderness Pulses  2+ Musculoskeletal: full range of motion, normal strength, no joint deformities Extremities: no clubbing cyanosis or edema, Homan's sign negative  Neurologic: grossly nonfocal; Cranial nerves grossly wnl Psychologic: Normal mood and affect   Studies/Labs Reviewed:   EKG:  EKG is ordered today.  ECG (independently read by me): Normal sinus rhythm at 77 bpm.  Probable biatrial enlargement.  QS complex V1 through V4 consistent with his anterior wall myocardial infarction.  Normal intervals.  Recent Labs: BMP Latest Ref Rng & Units 03/09/2019 02/11/2019 02/10/2019  Glucose 65 - 99 mg/dL 88 102(H) 118(H)  BUN 6 - 24 mg/dL _1 Creatinine 0.76 - 1.27 mg/dL 1.04 1.12 0.90  BUN/Creat Ratio 9 - 20 17 - -  Sodium 134 - 144 mmol/L 143 137 137  Potassium 3.5 - 5.2 mmol/L 4.9 3.9 4.0  Chloride 96 - 106 mmol/L 107(H) 106 101  CO2 20 - 29 mmol/L 25 22 -  Calcium 8.7 - 10.2 mg/dL 9.8 8.8(L) -     Hepatic Function Latest Ref Rng & Units 03/09/2019  Total Protein 6.0 - 8.5 g/dL 6.7  Albumin 3.8 - 4.9 g/dL 4.4  AST 0 - 40 IU/L 21  ALT 0 - 44 IU/L 25  Alk Phosphatase 39 - 117 IU/L 86  Total Bilirubin 0.0 - 1.2 mg/dL 0.4    CBC Latest Ref Rng & Units 02/11/2019 02/10/2019 02/10/2019  WBC 4.0 - 10.5 K/uL 9.7 - 7.0  Hemoglobin 13.0 - 17.0 g/dL 13.4 13.3 15.3  Hematocrit 39.0 - 52.0 % 40.6 39.0 44.9  Platelets 150 - 400 K/uL 194 - 226   Lab Results  Component Value Date   MCV 97.1 02/11/2019   MCV 96.8 02/10/2019   No results found for: TSH No results found for: HGBA1C   BNP No results found for: BNP  ProBNP No results found for: PROBNP   Lipid Panel     Component Value Date/Time   CHOL 83 (L) 03/09/2019 0823   TRIG 55 03/09/2019 0823   HDL 43 03/09/2019 0823   CHOLHDL 1.9 03/09/2019 0823   CHOLHDL 3.8 02/10/2019 0358   VLDL 63 (H) 02/10/2019 0358   LDLCALC 27 03/09/2019 0823   LABVLDL 13 03/09/2019 0823     RADIOLOGY: ECHOCARDIOGRAM COMPLETE  Result Date: 06/21/2019    ECHOCARDIOGRAM REPORT   Patient Name:   Cory Berry Date of Exam: 06/21/2019 Medical  Rec #:  [id number]     Height:       67.0 in Accession #:    [id number]    Weight:       191.4 lb Date of Birth:  1960/10/11     BSA:          1.98 m Patient Age:    46 years      BP:           130/78 mmHg Patient Gender: M             HR:           78 bpm. Exam Location:  [place] Procedure: 2D Echo, 3D Echo, Cardiac Doppler and Color Doppler Indications:    I51.9 Left Ventricular Dysfunction  History:        Patient has prior history of Echocardiogram examinations, most                 recent 02/10/2019. Previous Myocardial Infarction and CAD; Risk                 Factors:Current Smoker. Coronary Stent (September 2020), Asthma.  Sonographer:    Deliah Boston RDCS Referring Phys: [phone] HAO MENG IMPRESSIONS  1. Left ventricular ejection fraction, by visual estimation, is 30 to 35%. The left ventricle has moderate to severely decreased function. There is no left ventricular hypertrophy.  2. Left ventricular diastolic parameters are consistent with Grade I diastolic dysfunction (impaired relaxation).  3. Mildly dilated left ventricular internal cavity size.  4. The left ventricle demonstrates regional wall motion abnormalities.  5. Global right ventricle has normal systolic function.The right ventricular size is normal.  6. Left atrial size was normal.  7. Right atrial size was normal.  8. The mitral valve is normal in structure. Trivial mitral valve regurgitation. No evidence of mitral stenosis.  9. The tricuspid valve is normal in structure. Tricuspid valve regurgitation is mild. 10. The aortic valve is tricuspid. Aortic valve regurgitation is trivial. No evidence of aortic valve sclerosis or stenosis. 11. The pulmonic valve was normal in structure. Pulmonic valve regurgitation is not visualized. 12. Aortic dilatation noted. 13. There is mild dilatation of the ascending aorta measuring 38 mm. 14. The inferior vena cava is  normal in size with greater than 50% respiratory variability, suggesting right atrial pressure of 3 mmHg. 15. Akinesis of the anteroseptal and apical walls with overall moderate to severe LV dysfunction; grade 1 diastolic dysfunction; mild LVE; trace AI. FINDINGS  Left Ventricle: Left ventricular ejection fraction, by visual estimation, is 30 to 35%. The left ventricle has moderate to severely decreased function. The left ventricle demonstrates regional wall motion abnormalities. The left ventricular internal cavity size was mildly dilated left ventricle. There is no left ventricular hypertrophy. Left ventricular diastolic parameters are consistent with Grade I diastolic dysfunction (impaired relaxation). Right Ventricle: The right ventricular size is normal.Global RV systolic function is has normal systolic function. The tricuspid regurgitant velocity is 2.57 m/s, and with an assumed right atrial pressure of 3 mmHg, the estimated right ventricular systolic pressure is normal at 29.4 mmHg. Left Atrium: Left atrial size was normal in size. Right Atrium: Right atrial size was normal in size Pericardium: There is no evidence of pericardial effusion. Mitral Valve: The mitral valve is normal in structure. Trivial mitral valve regurgitation. No evidence of mitral valve stenosis by observation. Tricuspid Valve: The tricuspid valve is normal in structure. Tricuspid valve regurgitation is mild. Aortic Valve: The aortic valve is  tricuspid. Aortic valve regurgitation is trivial. The aortic valve is structurally normal, with no evidence of sclerosis or stenosis. Pulmonic Valve: The pulmonic valve was normal in structure. Pulmonic valve regurgitation is not visualized. Pulmonic regurgitation is not visualized. Aorta: Aortic dilatation noted. There is mild dilatation of the ascending aorta measuring 38 mm. Venous: The inferior vena cava is normal in size with greater than 50% respiratory variability, suggesting right atrial  pressure of 3 mmHg. IAS/Shunts: No atrial level shunt detected by color flow Doppler. Additional Comments: Akinesis of the anteroseptal and apical walls with overall moderate to severe LV dysfunction; grade 1 diastolic dysfunction; mild LVE; trace AI.  LEFT VENTRICLE PLAX 2D LVIDd:         5.67 cm  Diastology LVIDs:         3.76 cm  LV e' lateral:   9.14 cm/s LV PW:         0.82 cm  LV E/e' lateral: 8.3 LV IVS:        0.84 cm  LV e' medial:    8.81 cm/s LVOT diam:     2.40 cm  LV E/e' medial:  8.6 LV SV:         98 ml LV SV Index:   47.65 LVOT Area:     4.52 cm  RIGHT VENTRICLE RV S prime:     13.10 cm/s TAPSE (M-mode): 2.6 cm LEFT ATRIUM             Index       RIGHT ATRIUM           Index LA diam:        3.80 cm 1.91 cm/m  RA Area:     18.90 cm LA Vol (A2C):   67.0 ml 33.76 ml/m RA Volume:   48.30 ml  24.33 ml/m LA Vol (A4C):   54.6 ml 27.51 ml/m LA Biplane Vol: 62.0 ml 31.24 ml/m  AORTIC VALVE LVOT Vmax:   73.90 cm/s LVOT Vmean:  49.000 cm/s LVOT VTI:    0.187 m  AORTA Ao Root diam: 3.80 cm Ao Asc diam:  3.80 cm MITRAL VALVE                         TRICUSPID VALVE MV Area (PHT)  cm                   TR Peak grad:   26.4 mmHg MV PHT:        msec                  TR Vmax:        280.00 cm/s MV Decel Time: 158 msec MV E velocity: 76.20 cm/s  103 cm/s  SHUNTS MV A velocity: 102.00 cm/s 70.3 cm/s Systemic VTI:  0.19 m MV E/A ratio:  0.75        1.5       Systemic Diam: 2.40 cm  Kirk Ruths MD Electronically signed by Kirk Ruths MD Signature Date/Time: 06/21/2019/5:09:11 PM    Final      Additional studies/ records that were reviewed today include:  I reviewed the patient's hospitalization records from September 2020.  I have reviewed catheterization findings, echo Doppler study, subsequent evaluation with Almyra Deforest, PA, and most recent echo Doppler evaluation from June 21, 2019.  ASSESSMENT:    1. Acute ST elevation myocardial infarction (STEMI) due to occlusion of proximal portion of LAD  coronary artery (  [facility]): February 10, 2019, treated with DES stent   2. Ischemic cardiomyopathy   3. Hyperlipidemia with target LDL less than 70   4. Tobacco abuse   5. Medication management     PLAN:  Cory Berry is a 59 year old gentleman  originally from the [place] who denied any prior cardiac history but had a long history of tobacco use.  He presented with an acute coronary syndrome on February 10, 2019 and was found to have total proximal occlusion of a large LAD which was successfully stented.  Subsequently, he has had reduced LV function with EF approximately 35%.  His most recent echo Doppler study despite being on losartan 25 mg and Toprol-XL 25 mg continues to show reduced LV function with EF at 30 to 35% with grade 1 diastolic dysfunction.  There was mild dilation of his left ventricular.  I thoroughly reviewed his catheterization findings and subsequent echo reports with him in detail.  Clinically he feels well.  His blood pressure today is stable.  His ECG is concordant with his echo findings and shows a QS complex in V1 through V4.  With his reduced LV function I have recommended transition to [facility].  I provided him with samples of Entresto 24/26 mg today which will last 2 weeks.  Today he has taken his dose of losartan.  He will start [facility] tomorrow morning.  I have recommended he see our pharmacist in 2 weeks prior to his Entresto samples running out and if at that time his blood pressure remains stable further titration to 49/51 mg twice daily should be done.  He continues to be on atorvastatin 80 mg for lipid management with target LDL less than 70.  He continues to be on DAPT and I discussed with him the need for continuation of treatment for minimum of 1 year since this was initiated in the setting of an acute coronary syndrome.  I discussed the importance of complete smoking cessation.  I will see him in approximately 6 to 8 weeks for reevaluation.  Medication  Adjustments/Labs and Tests Ordered: Current medicines are reviewed at length with the patient today.  Concerns regarding medicines are outlined above.  Medication changes, Labs and Tests ordered today are listed in the Patient Instructions below. Patient Instructions  Medication Instructions:  STOP TAKING LOSARTAN SAMPLES OF ENTRESTO GIVEN-BEGIN TAKING ENTRESTO 24-26 ( 1 TABLET TWICE A DAY) *If you need a refill on your cardiac medications before your next appointment, please call your pharmacy*  Lab Work: IN 2 [place] If you have labs (blood work) drawn today and your tests are completely normal, you will receive your results only by: Marland Kitchen MyChart Message (if you have MyChart) OR . A paper copy in the mail If you have any lab test that is abnormal or we need to change your treatment, we will call you to review the results.  Follow-Up: At [facility], you and your health needs are our priority.  As part of our continuing mission to provide you with exceptional heart care, we have created designated Provider Care Teams.  These Care Teams include your primary Cardiologist (physician) and Advanced Practice Providers (APPs -  Physician Assistants and Nurse Practitioners) who all work together to provide you with the care you need, when you need it.  Your next appointment:   6-8 week(s)  The format for your next appointment:   In Person  Provider:   Shelva Majestic, MD  Other Instructions  FOLLOW UP WITH PHARMACY ([facility]) FOR MEDICATION (ENTRESTO)  IN 2 WEEKS     Signed, Shelva Majestic, MD  07/07/2019 10:29 AM    [facility] [address], [place], [place], [place]  [address] Phone: [phone]

## 2019-07-07 ENCOUNTER — Encounter: Payer: Self-pay | Admitting: Cardiovascular Disease

## 2019-07-19 ENCOUNTER — Other Ambulatory Visit: Payer: Self-pay

## 2019-07-19 ENCOUNTER — Ambulatory Visit (INDEPENDENT_AMBULATORY_CARE_PROVIDER_SITE_OTHER): Payer: Managed Care, Other (non HMO) | Admitting: Pharmacist

## 2019-07-19 DIAGNOSIS — I5020 Unspecified systolic (congestive) heart failure: Secondary | ICD-10-CM | POA: Diagnosis not present

## 2019-07-19 LAB — COMPREHENSIVE METABOLIC PANEL
ALT: 20 IU/L (ref 0–44)
AST: 20 IU/L (ref 0–40)
Albumin/Globulin Ratio: 1.3 (ref 1.2–2.2)
Albumin: 3.9 g/dL (ref 3.8–4.9)
Alkaline Phosphatase: 84 IU/L (ref 39–117)
BUN/Creatinine Ratio: 15 (ref 9–20)
BUN: 19 mg/dL (ref 6–24)
Bilirubin Total: 0.2 mg/dL (ref 0.0–1.2)
CO2: 25 mmol/L (ref 20–29)
Calcium: 9.4 mg/dL (ref 8.7–10.2)
Chloride: 104 mmol/L (ref 96–106)
Creatinine, Ser: 1.24 mg/dL (ref 0.76–1.27)
GFR calc Af Amer: 74 mL/min/{1.73_m2} (ref >59)
GFR calc non Af Amer: 64 mL/min/{1.73_m2} (ref >59)
Globulin, Total: 2.9 g/dL (ref 1.5–4.5)
Glucose: 93 mg/dL (ref 65–99)
Potassium: 4.5 mmol/L (ref 3.5–5.2)
Sodium: 142 mmol/L (ref 134–144)
Total Protein: 6.8 g/dL (ref 6.0–8.5)

## 2019-07-19 LAB — PRO B NATRIURETIC PEPTIDE: NT-Pro BNP: 257 pg/mL — ABNORMAL HIGH (ref 0–210)

## 2019-07-19 MED ORDER — ENTRESTO 49-51 MG PO TABS: 1.0000 | ORAL_TABLET | Freq: Two times a day (BID) | ORAL | 0 refills | Status: DC

## 2019-07-19 NOTE — Patient Instructions (Signed)
Return for a  follow up appointment in 2 weeks  Go to the lab in 2 weeks (day before appointments)  Check your blood pressure at home daily (if able) and keep record of the readings.  Take your BP meds as follows: *INCREASE hydration* *INCREASE Entresto to 49/51mg  twice daily*  Bring all of your meds, your BP cuff and your record of home blood pressures to your next appointment.  Exercise as you're able, try to walk approximately 30 minutes per day.  Keep salt intake to a minimum, especially watch canned and prepared boxed foods.  Eat more fresh fruits and vegetables and fewer canned items.  Avoid eating in fast food restaurants.    HOW TO TAKE YOUR BLOOD PRESSURE: . Rest 5 minutes before taking your blood pressure. .  Don't smoke or drink caffeinated beverages for at least 30 minutes before. . Take your blood pressure before (not after) you eat. . Sit comfortably with your back supported and both feet on the floor (don't cross your legs). . Elevate your arm to heart level on a table or a desk. . Use the proper sized cuff. It should fit smoothly and snugly around your bare upper arm. There should be enough room to slip a fingertip under the cuff. The bottom edge of the cuff should be 1 inch above the crease of the elbow. . Ideally, take 3 measurements at one sitting and record the average.

## 2019-07-19 NOTE — Progress Notes (Signed)
Patient ID: Cory Berry                 DOB: 30-Nov-1960                      MRN: [id number]     HPI: Cory Berry is a 59 y.o. male referred by Dr. Tresa Berry to HTN clinic. PMH includes mixed hyperlipidemia, CAD, HFrEF (EF 30-35% on Jan/2021), and tobacco abuse. Patient will like to quit cigarettes,  but is not ready to start any therapy or set quit date yet. He is tolerating Entresto without issues and is almost out of samples provided. Repeat BMEt shows stable renal function and electrolytes.   Current HTN meds:  Entresto 24/26mg  twice daily Metoprolol succinate 25mg  daily  Previously tried:  Losartan 25mg  daily  BP goal: <130/80  Family History: Family history is negative for heart disease  Social History: current smoker, alcohol use (3-4 beers daily)  Diet: Family history is negative for heart disease  Exercise: ~ 3 miles walk daily of operation  Home BP readings: none available  Wt Readings from Last 3 Encounters:  07/19/19 189 lb 4.8 oz (85.9 kg)  07/05/19 191 lb 6.4 oz (86.8 kg)  02/23/19 191 lb 6.4 oz (86.8 kg)   BP Readings from Last 3 Encounters:  07/19/19 120/80  07/05/19 129/83  02/23/19 130/78   Pulse Readings from Last 3 Encounters:  07/19/19 83  07/05/19 89  02/23/19 80    Renal function: Estimated Creatinine Clearance: 68.8 mL/min (by C-G formula based on SCr of 1.24 mg/dL).  Past Medical History:  Diagnosis Date  . Asthma     Current Outpatient Medications on File Prior to Visit  Medication Sig Dispense Refill  . albuterol (VENTOLIN HFA) 108 (90 Base) MCG/ACT inhaler Inhale 1-2 puffs into the lungs every 6 (six) hours as needed for wheezing or shortness of breath.    09/02/19 aspirin 81 MG chewable tablet Chew 1 tablet (81 mg total) by mouth daily. 30 tablet 12  . atorvastatin (LIPITOR) 80 MG tablet Take 1 tablet (80 mg total) by mouth daily at 6 PM. 30 tablet 3  . metoprolol succinate (TOPROL-XL) 25 MG 24 hr tablet Take 1 tablet (25 mg total)  by mouth daily. 30 tablet 3  . nitroGLYCERIN (NITROSTAT) 0.4 MG SL tablet Place 1 tablet (0.4 mg total) under the tongue every 5 (five) minutes x 3 doses as needed for chest pain. 25 tablet 12  . ticagrelor (BRILINTA) 90 MG TABS tablet Take 1 tablet (90 mg total) by mouth 2 (two) times daily. 60 tablet 12   No current facility-administered medications on file prior to visit.    No Known Allergies  Blood pressure 120/80, pulse 83, resp. rate 14, height 5\' 7"  (1.702 m), weight 189 lb 4.8 oz (85.9 kg), SpO2 98 %.  HFrEF (heart failure with reduced ejection fraction) (HCC) Blood pressure remains stable and appropriate to further titrate Entresto. Will increase Entresto dose to 49/51mg  twice daily and provide samples for 2 weeks. Patient is to monitor BP at home, repeat BMET 2 days prior to follow up appointment and contact office with problems or questions between visits. Plan to add low dose spironolactone during next OV if patient able to tolerate and renal function remains stable.   Loneta Tamplin Rodriguez-Guzman PharmD, BCPS, CPP [facility] [address] [place] [facility] 07/30/2019 3:21 PM

## 2019-07-24 ENCOUNTER — Ambulatory Visit: Payer: Managed Care, Other (non HMO)

## 2019-07-30 ENCOUNTER — Encounter: Payer: Self-pay | Admitting: Pharmacist

## 2019-07-30 NOTE — Assessment & Plan Note (Signed)
Blood pressure remains stable and appropriate to further titrate Entresto. Will increase Entresto dose to 49/51mg  twice daily and provide samples for 2 weeks. Patient is to monitor BP at home, repeat BMET 2 days prior to follow up appointment and contact office with problems or questions between visits. Plan to add low dose spironolactone during next OV if patient able to tolerate and renal function remains stable.

## 2019-08-02 ENCOUNTER — Other Ambulatory Visit: Payer: Self-pay

## 2019-08-02 ENCOUNTER — Ambulatory Visit (INDEPENDENT_AMBULATORY_CARE_PROVIDER_SITE_OTHER): Payer: Managed Care, Other (non HMO) | Admitting: Pharmacist

## 2019-08-02 VITALS — BP 106/70 | HR 72 | Resp 16 | Ht 67.0 in

## 2019-08-02 DIAGNOSIS — I5020 Unspecified systolic (congestive) heart failure: Secondary | ICD-10-CM | POA: Diagnosis not present

## 2019-08-02 LAB — BASIC METABOLIC PANEL
BUN/Creatinine Ratio: 21 — ABNORMAL HIGH (ref 9–20)
BUN: 28 mg/dL — ABNORMAL HIGH (ref 6–24)
CO2: 21 mmol/L (ref 20–29)
Calcium: 9.4 mg/dL (ref 8.7–10.2)
Chloride: 104 mmol/L (ref 96–106)
Creatinine, Ser: 1.32 mg/dL — ABNORMAL HIGH (ref 0.76–1.27)
GFR calc Af Amer: 69 mL/min/{1.73_m2} (ref >59)
GFR calc non Af Amer: 59 mL/min/{1.73_m2} — ABNORMAL LOW (ref >59)
Glucose: 76 mg/dL (ref 65–99)
Potassium: 4.5 mmol/L (ref 3.5–5.2)
Sodium: 141 mmol/L (ref 134–144)

## 2019-08-02 MED ORDER — ENTRESTO 49-51 MG PO TABS: 1.0000 | ORAL_TABLET | Freq: Two times a day (BID) | ORAL | 1 refills | Status: DC

## 2019-08-02 NOTE — Progress Notes (Signed)
Patient ID: Cory Berry                 DOB: Feb 17, 1961                      MRN: [id number]     HPI: Cory Berry is a 59 y.o. male referred by Dr. Tresa Endo to HTN clinic. PMH includes mixed hyperlipidemia, CAD, HFrEF (EF 30-35% on Jan/2021), and tobacco abuse. Patient will like to quit cigarettes,  but is not ready to start any therapy or set quit date yet. He is tolerating Entresto without issues and is almost out of samples provided. Repeat BMEt shows stable renal function and electrolytes.   Current HTN meds:  Entresto 49/51mg  twice daily Metoprolol succinate 25mg  daily  Previously tried:  Losartan 25mg  daily  BP goal: <130/80  Family History: Family history is negative for heart disease  Social History: current smoker, alcohol use (3-4 beers daily)  Diet: Family history is negative for heart disease  Exercise: ~ 3 miles walk daily of operation  Home BP readings: none available  Wt Readings from Last 3 Encounters:  07/19/19 189 lb 4.8 oz (85.9 kg)  07/05/19 191 lb 6.4 oz (86.8 kg)  02/23/19 191 lb 6.4 oz (86.8 kg)   BP Readings from Last 3 Encounters:  08/02/19 106/70  07/19/19 120/80  07/05/19 129/83   Pulse Readings from Last 3 Encounters:  08/02/19 72  07/19/19 83  07/05/19 89    Renal function: CrCl cannot be calculated (Unknown ideal weight.).  Past Medical History:  Diagnosis Date  . Asthma     Current Outpatient Medications on File Prior to Visit  Medication Sig Dispense Refill  . albuterol (VENTOLIN HFA) 108 (90 Base) MCG/ACT inhaler Inhale 1-2 puffs into the lungs every 6 (six) hours as needed for wheezing or shortness of breath.    07/21/19 aspirin 81 MG chewable tablet Chew 1 tablet (81 mg total) by mouth daily. 30 tablet 12  . atorvastatin (LIPITOR) 80 MG tablet Take 1 tablet (80 mg total) by mouth daily at 6 PM. 30 tablet 3  . metoprolol succinate (TOPROL-XL) 25 MG 24 hr tablet Take 1 tablet (25 mg total) by mouth daily. 30 tablet 3  .  nitroGLYCERIN (NITROSTAT) 0.4 MG SL tablet Place 1 tablet (0.4 mg total) under the tongue every 5 (five) minutes x 3 doses as needed for chest pain. 25 tablet 12  . ticagrelor (BRILINTA) 90 MG TABS tablet Take 1 tablet (90 mg total) by mouth 2 (two) times daily. 60 tablet 12   No current facility-administered medications on file prior to visit.    No Known Allergies  Blood pressure 106/70, pulse 72, resp. rate 16, height 5\' 7"  (1.702 m), SpO2 94 %.  HFrEF (heart failure with reduced ejection fraction) (HCC) Blood pressure at lower end of normal limit and not appropriate for further Entresto titration. Note slight increase in Scr but stable since Entresto initation. Will HOLD on adding spironolactone for now. Patient was instructed to increase hydration and follow up with DR 09/02/19 as previously scheduled. Plan to repeat BMET during next OV and consider adding low dose spironolactone if renal function and potassium level remain appropriate.    Cory Berry PharmD, BCPS, CPP [facility] [address] [place] [facility] 08/09/2019 5:26 PM

## 2019-08-02 NOTE — Patient Instructions (Signed)
Return for a a follow up appointment as needed  Check your blood pressure at home daily (if able) and keep record of the readings.  Take your BP meds as follows: *NO medication change* * INCREASE hydration*  Bring all of your meds, your BP cuff and your record of home blood pressures to your next appointment.  Exercise as you're able, try to walk approximately 30 minutes per day.  Keep salt intake to a minimum, especially watch canned and prepared boxed foods.  Eat more fresh fruits and vegetables and fewer canned items.  Avoid eating in fast food restaurants.    HOW TO TAKE YOUR BLOOD PRESSURE: . Rest 5 minutes before taking your blood pressure. .  Don't smoke or drink caffeinated beverages for at least 30 minutes before. . Take your blood pressure before (not after) you eat. . Sit comfortably with your back supported and both feet on the floor (don't cross your legs). . Elevate your arm to heart level on a table or a desk. . Use the proper sized cuff. It should fit smoothly and snugly around your bare upper arm. There should be enough room to slip a fingertip under the cuff. The bottom edge of the cuff should be 1 inch above the crease of the elbow. . Ideally, take 3 measurements at one sitting and record the average.

## 2019-08-08 ENCOUNTER — Telehealth: Payer: Self-pay | Admitting: Cardiovascular Disease

## 2019-08-08 NOTE — Telephone Encounter (Signed)
[id number]

## 2019-08-08 NOTE — Telephone Encounter (Signed)
New Message  Patient is returning call. Please give patient a call back about results.  

## 2019-08-09 ENCOUNTER — Encounter: Payer: Self-pay | Admitting: Pharmacist

## 2019-08-09 NOTE — Assessment & Plan Note (Signed)
Blood pressure at lower end of normal limit and not appropriate for further Entresto titration. Note slight increase in Scr but stable since Entresto initation. Will HOLD on adding spironolactone for now. Patient was instructed to increase hydration and follow up with DR Tresa Endo as previously scheduled. Plan to repeat BMET during next OV and consider adding low dose spironolactone if renal function and potassium level remain appropriate.

## 2019-08-10 NOTE — Telephone Encounter (Signed)
[facility] 3/19

## 2019-08-15 NOTE — Telephone Encounter (Signed)
Spoke with pt and notified of lab results. Reminded of upcoming office visit with Dr.Kelly on 09/26/19 at 8:40am  Pt verbalized understanding No other questions or concerns at this time

## 2019-08-16 ENCOUNTER — Ambulatory Visit: Payer: Managed Care, Other (non HMO)

## 2019-09-26 ENCOUNTER — Other Ambulatory Visit: Payer: Self-pay

## 2019-09-26 ENCOUNTER — Encounter: Payer: Self-pay | Admitting: Cardiovascular Disease

## 2019-09-26 ENCOUNTER — Encounter (INDEPENDENT_AMBULATORY_CARE_PROVIDER_SITE_OTHER): Payer: Self-pay

## 2019-09-26 ENCOUNTER — Ambulatory Visit (INDEPENDENT_AMBULATORY_CARE_PROVIDER_SITE_OTHER): Payer: Managed Care, Other (non HMO) | Admitting: Cardiovascular Disease

## 2019-09-26 VITALS — BP 114/72 | HR 82 | Ht 67.0 in | Wt 193.6 lb

## 2019-09-26 DIAGNOSIS — Z720 Tobacco use: Secondary | ICD-10-CM

## 2019-09-26 DIAGNOSIS — E785 Hyperlipidemia, unspecified: Secondary | ICD-10-CM | POA: Diagnosis not present

## 2019-09-26 DIAGNOSIS — I2102 ST elevation (STEMI) myocardial infarction involving left anterior descending coronary artery: Secondary | ICD-10-CM

## 2019-09-26 DIAGNOSIS — I255 Ischemic cardiomyopathy: Secondary | ICD-10-CM | POA: Diagnosis not present

## 2019-09-26 DIAGNOSIS — Z79899 Other long term (current) drug therapy: Secondary | ICD-10-CM

## 2019-09-26 DIAGNOSIS — I5020 Unspecified systolic (congestive) heart failure: Secondary | ICD-10-CM

## 2019-09-26 MED ORDER — SPIRONOLACTONE 25 MG PO TABS: 12.5000 mg | ORAL_TABLET | Freq: Every day | ORAL | 3 refills | Status: DC

## 2019-09-26 NOTE — Patient Instructions (Addendum)
Medication Instructions:  BEGIN TAKING SPIRONOLACTONE 12.5MG  (1/2 TABLET) DAILY  *If you need a refill on your cardiac medications before your next appointment, please call your pharmacy*   Lab Work: IN ONE WEEK: BMET PRO-BNP  If you have labs (blood work) drawn today and your tests are completely normal, you will receive your results only by: Marland Kitchen MyChart Message (if you have MyChart) OR . A paper copy in the mail If you have any lab test that is abnormal or we need to change your treatment, we will call you to review the results.   Follow-Up: At [facility], you and your health needs are our priority.  As part of our continuing mission to provide you with exceptional heart care, we have created designated Provider Care Teams.  These Care Teams include your primary Cardiologist (physician) and Advanced Practice Providers (APPs -  Physician Assistants and Nurse Practitioners) who all work together to provide you with the care you need, when you need it.  We recommend signing up for the patient portal called "MyChart".  Sign up information is provided on this After Visit Summary.  MyChart is used to connect with patients for Virtual Visits (Telemedicine).  Patients are able to view lab/test results, encounter notes, upcoming appointments, etc.  Non-urgent messages can be sent to your provider as well.   To learn more about what you can do with MyChart, go to [email or web address].    Your next appointment:  IN JULY 2 month(s)  The format for your next appointment:   In Person  Provider:   Nicki Guadalajara, MD

## 2019-09-26 NOTE — Progress Notes (Signed)
Cardiology Office Note    Date:  09/28/2019   ID:  Cory Berry, DOB May 02, 1961, MRN [id number]  PCP:  Patient, No Pcp Per  Cardiologist:  Shelva Majestic, MD   F/u  office visit   History of Present Illness:  Cory Berry is a 59 y.o. male who presents for a 80-monthfollow-up cardiology evaluation   [place].  On February 10, 2019 he developed new onset chest pain, and ECG showed ST elevation in V5 and V6 and a code STEMI was activated.  He underwent emergent cardiac catheterization and intervention by me and was found to have total occlusion of the proximal LAD.  There was 40% first marginal stenosis and he had a dominant RCA with luminal regularity.  He underwent successful emergent PCI to his LAD with ultimate insertion of a 3.0 x 32 mm Synergy DES stent postdilated to 3.3-3.25 taper with stenosis improved to 0% and resumption of TIMI-3 flow.  An in-hospital echo Doppler study on February 10, 2019 showed EF of 35 to 40%.   He was subsequently evaluated in the office on February 23, 2019 by Cory Berry  At that time he was stable without recurrent chest pain.  He was on aspirin/Brilinta for DAPT.  Losartan 25 mg was added to his regimen of Toprol-XL 25 mg.  He has tolerated that well.  He has continued to be on atorvastatin 80 mg.  He was referred for follow-up echo Doppler study which was recently completed on June 21, 1999.  EF remains low at 30 to 35% and there was grade 1 diastolic dysfunction.  There were regional wall motion abnormalities.  I saw him for his initial evaluation with me in the office on July 05, 2019.  At that time he denied any recurrent chest pain PND orthopnea or palpitations.  Historically, he had started smoking as a teenager and after smoking for many years had quit smoking for over 23 years.  Unfortunately he had restarted smoking 10 years ago.  He had been  smoking up to 2 packs/day and since his myocardial infarction he has significantly reduced his smoking to less than 1 pack/day for continues to smoke.  When his initial evaluation, with his reduced LV function I [facility] of 24/26 mg twice a day 2 weeks and and recommended he discontinue losartan.  I recommended follow-up with our pharmacy clinic.  Most recently, he has been on Entresto 49/51 mg twice a day, Toprol-XL 25 mg in addition to aspirin and Brilinta and atorvastatin 80 mg.  He has felt well.  He denies chest pain or shortness of breath.  He is reducing his cigarettes but unfortunately is still smoking.  He tells me he is contemplating a possible job promotion which [place]  He is uncertain about this presently.  He presents for evaluation.  Past Medical History:  Diagnosis Date  . Asthma     Past Surgical History:  Procedure Laterality Date  . CORONARY STENT INTERVENTION N/A 02/10/2019   Procedure: CORONARY STENT INTERVENTION;  Surgeon: KTroy Sine MD;  [facility];  Service: Cardiovascular;  Laterality: N/A;  . CORONARY/GRAFT ACUTE MI REVASCULARIZATION N/A 02/10/2019   Procedure: Coronary/Graft Acute MI Revascularization;  Surgeon: KTroy Sine MD;  [facility];  Service: Cardiovascular;  Laterality: N/A;  . LEFT HEART CATH AND CORONARY ANGIOGRAPHY N/A 02/10/2019   Procedure: LEFT HEART CATH AND CORONARY ANGIOGRAPHY;  Surgeon: Troy Sine, MD;  Location: [place] [facility];  Service: Cardiovascular;  Laterality: N/A;  . NECK SURGERY      Current Medications: Outpatient Medications Prior to Visit  Medication Sig Dispense Refill  . albuterol (VENTOLIN HFA) 108 (90 Base) MCG/ACT inhaler Inhale 1-2 puffs into the lungs every 6 (six) hours as needed for wheezing or shortness of breath.    Marland Kitchen aspirin 81 MG chewable tablet Chew 1 tablet (81 mg total)  by mouth daily. 30 tablet 12  . atorvastatin (LIPITOR) 80 MG tablet Take 1 tablet (80 mg total) by mouth daily at 6 PM. 30 tablet 3  . metoprolol succinate (TOPROL-XL) 25 MG 24 hr tablet Take 1 tablet (25 mg total) by mouth daily. 30 tablet 3  . nitroGLYCERIN (NITROSTAT) 0.4 MG SL tablet Place 1 tablet (0.4 mg total) under the tongue every 5 (five) minutes x 3 doses as needed for chest pain. 25 tablet 12  . sacubitril-valsartan (ENTRESTO) 49-51 MG Take 1 tablet by mouth 2 (two) times daily. 60 tablet 1  . ticagrelor (BRILINTA) 90 MG TABS tablet Take 1 tablet (90 mg total) by mouth 2 (two) times daily. 60 tablet 12   No facility-administered medications prior to visit.     Allergies:   Patient has no known allergies.   Social History   Socioeconomic History  . Marital status: Married    Spouse name: Not on file  . Number of children: Not on file  . Years of education: Not on file  . Highest education level: Not on file  Occupational History  . Not on file  Tobacco Use  . Smoking status: Current Every Day Smoker  . Smokeless tobacco: Never Used  Substance and Sexual Activity  . Alcohol use: Yes  . Drug use: Never  . Sexual activity: Not on file  Other Topics Concern  . Not on file  Social History Narrative  . Not on file   Social Determinants of Health   Financial Resource Strain:   . Difficulty of Paying Living Expenses:   Food Insecurity:   . Worried About Charity fundraiser in the Last Year:   . Arboriculturist in the Last Year:   Transportation Needs:   . Film/video editor (Medical):   Marland Kitchen Lack of Transportation (Non-Medical):   Physical Activity:   . Days of Exercise per Week:   . Minutes of Exercise per Session:   Stress:   . Feeling of Stress :   Social Connections:   . Frequency of Communication with Friends and Family:   . Frequency of Social Gatherings with Friends and Family:   . Attends Religious Services:   . Active Member of Clubs or Organizations:    . Attends Archivist Meetings:   Marland Kitchen Marital Status:     Socially he was born in the [place].  He moved to [place] approximately 10 years ago.  Over the last several years due to his job he moved to the [place] area.  He currently works as a Field seismologist in a [employer] and typically walks 3 miles per day at work.  Family History: Family history is negative for heart disease  ROS General: Negative; No fevers, chills, or night sweats;  HEENT: Negative; No changes in vision or hearing, sinus congestion, difficulty swallowing Pulmonary: Negative; No cough,  wheezing, shortness of breath, hemoptysis Cardiovascular: Negative; No chest pain, presyncope, syncope, palpitations GI: Negative; No nausea, vomiting, diarrhea, or abdominal pain GU: Negative; No dysuria, hematuria, or difficulty voiding Musculoskeletal: Negative; no myalgias, joint pain, or weakness Hematologic/Oncology: Negative; no easy bruising, bleeding Endocrine: Negative; no heat/cold intolerance; no diabetes Neuro: Negative; no changes in balance, headaches Skin: Negative; No rashes or skin lesions Psychiatric: Negative; No behavioral problems, depression Sleep: Negative; No snoring, daytime sleepiness, hypersomnolence, bruxism, restless legs, hypnogognic hallucinations, no cataplexy Other comprehensive 14 point system review is negative.   PHYSICAL EXAM:   VS:  BP 114/72   Pulse 82   Ht _0  (1.702 m)   Wt 193 lb 9.6 oz (87.8 kg)   SpO2 98%   BMI 30.32 kg/m     Repeat blood pressure by me was 128/76  Wt Readings from Last 3 Encounters:  09/26/19 193 lb 9.6 oz (87.8 kg)  07/19/19 189 lb 4.8 oz (85.9 kg)  07/05/19 191 lb 6.4 oz (86.8 kg)    General: Alert, oriented, no distress.  Skin: normal turgor, no rashes, warm and dry HEENT: Normocephalic, atraumatic. Pupils equal round and reactive to light; sclera anicteric; extraocular muscles intact; Nose without nasal septal  hypertrophy Mouth/Parynx benign; Mallinpatti scale 3 Neck: No JVD, no carotid bruits; normal carotid upstroke Lungs: clear to ausculatation and percussion; no wheezing or rales Chest wall: without tenderness to palpitation Heart: PMI not displaced, RRR, s1 s2 normal, 1/6 systolic murmur, no diastolic murmur, no rubs, gallops, thrills, or heaves Abdomen: soft, nontender; no hepatosplenomehaly, BS+; abdominal aorta nontender and not dilated by palpation. Back: no CVA tenderness Pulses 2+ Musculoskeletal: full range of motion, normal strength, no joint deformities Extremities: no clubbing cyanosis or edema, Homan's sign negative  Neurologic: grossly nonfocal; Cranial nerves grossly wnl Psychologic: Normal mood and affect   Studies/Labs Reviewed:   EKG:  EKG is ordered today.  ECG (independently read by me): Normal sinus rhythm at 77 bpm.  Probable biatrial enlargement.  QS complex V1 through V4 consistent with his anterior wall myocardial infarction.  Normal intervals.  Recent Labs: BMP Latest Ref Rng & Units 08/01/2019 07/18/2019 03/09/2019  Glucose 65 - 99 mg/dL 76 93 88  BUN 6 - 24 mg/dL 28(H) 19 18  Creatinine 0.76 - 1.27 mg/dL 1.32(H) 1.24 1.04  BUN/Creat Ratio 9 - 20 21(H) 15 17  Sodium 134 - 144 mmol/L 141 142 143  Potassium 3.5 - 5.2 mmol/L 4.5 4.5 4.9  Chloride 96 - 106 mmol/L 104 104 107(H)  CO2 20 - 29 mmol/L _1 Calcium 8.7 - 10.2 mg/dL 9.4 9.4 9.8     Hepatic Function Latest Ref Rng & Units 07/18/2019 03/09/2019  Total Protein 6.0 - 8.5 g/dL 6.8 6.7  Albumin 3.8 - 4.9 g/dL 3.9 4.4  AST 0 - 40 IU/L 20 21  ALT 0 - 44 IU/L 20 25  Alk Phosphatase 39 - 117 IU/L 84 86  Total Bilirubin 0.0 - 1.2 mg/dL 0.2 0.4    CBC Latest Ref Rng & Units 02/11/2019 02/10/2019 02/10/2019  WBC 4.0 - 10.5 K/uL 9.7 - 7.0  Hemoglobin 13.0 - 17.0 g/dL 13.4 13.3 15.3  Hematocrit 39.0 - 52.0 % 40.6 39.0 44.9  Platelets 150 - 400 K/uL 194 - 226   Lab Results  Component Value Date   MCV  97.1 02/11/2019   MCV 96.8 02/10/2019   No results found for: TSH No results found for: HGBA1C   BNP No results found  for: BNP  ProBNP    Component Value Date/Time   PROBNP 257 (H) 07/18/2019 0927     Lipid Panel     Component Value Date/Time   CHOL 83 (L) 03/09/2019 0823   TRIG 55 03/09/2019 0823   HDL 43 03/09/2019 0823   CHOLHDL 1.9 03/09/2019 0823   CHOLHDL 3.8 02/10/2019 0358   VLDL 63 (H) 02/10/2019 0358   LDLCALC 27 03/09/2019 0823   LABVLDL 13 03/09/2019 0823     RADIOLOGY: No results found.   Additional studies/ records that were reviewed today include:  I reviewed the patient's hospitalization records from September 2020.  I have reviewed catheterization findings, echo Doppler study, subsequent evaluation with Cory Deforest, PA, and most recent echo Doppler evaluation from June 21, 2019.  ASSESSMENT:    1. HFrEF (heart failure with reduced ejection fraction) ([place])   2. Acute ST elevation myocardial infarction (STEMI) due to occlusion of proximal portion of left anterior descending (LAD) coronary artery ([place]): 02/07/2019   3. Ischemic cardiomyopathy   4. Hyperlipidemia with target LDL less than 70   5. Medication management   6. Tobacco abuse     PLAN:  Cory Berry is a 59 year old gentleman  originally from the [place] who denied any prior cardiac history but had a long history of tobacco use.  He presented with an acute coronary syndrome on February 10, 2019 and was found to have total proximal occlusion of a large LAD which was successfully stented.  Subsequently, he has had reduced LV function with EF approximately 35%.  His most recent echo Doppler study despite being on losartan 25 mg and Toprol-XL 25 mg continues to show reduced LV function with EF at 30 to 35% with grade 1 diastolic dysfunction.  There was mild dilation of his left ventricular.  I thoroughly reviewed his catheterization findings and subsequent echo reports with him in  detail.  Clinically he feels well.  His blood pressure today is stable.  His ECG is concordant with his echo findings and shows a QS complex in V1 through V4.  At his initial office visit with me in February 2021 losartan was discontinued and he was transition to [place].  He apparently has now been on 49/51 mg twice per day according to his med sheet in addition to Toprol-XL 25 mg daily.  I have recommended initiation of spironolactone 12.5 mg daily.  In 1 week he will have a be met and I will also check a proBNP.  He appears euvolemic on exam.  He is not having any chest pain or shortness of breath.  I again strongly recommended complete discontinuance of all tobacco.  He is on atorvastatin 80 mg for hyperlipidemia.  Target LDL is less than 70.  Most recent serum creatinine was 1.32 on August 01, 2019.  He will have to make a decision about his job in approximately 3 months.  I will see him in 2 months for reevaluation and further recommendations will be made at that time.   Medication Adjustments/Labs and Tests Ordered: Current medicines are reviewed at length with the patient today.  Concerns regarding medicines are outlined above.  Medication changes, Labs and Tests ordered today are listed in the Patient Instructions below. Patient Instructions  Medication Instructions:  BEGIN TAKING SPIRONOLACTONE 12.5MG (1/2 TABLET) DAILY  *If you need a refill on your cardiac medications before your next appointment, please call your pharmacy*   Lab Work: IN ONE WEEK: BMET PRO-BNP  If you  have labs (blood work) drawn today and your tests are completely normal, you will receive your results only by: Marland Kitchen MyChart Message (if you have MyChart) OR . A paper copy in the mail If you have any lab test that is abnormal or we need to change your treatment, we will call you to review the results.   Follow-Up: At [facility], you and your health needs are our priority.  As part of our continuing mission to  provide you with exceptional heart care, we have created designated Provider Care Teams.  These Care Teams include your primary Cardiologist (physician) and Advanced Practice Providers (APPs -  Physician Assistants and Nurse Practitioners) who all work together to provide you with the care you need, when you need it.  We recommend signing up for the patient portal called "MyChart".  Sign up information is provided on this After Visit Summary.  MyChart is used to connect with patients for Virtual Visits (Telemedicine).  Patients are able to view lab/test results, encounter notes, upcoming appointments, etc.  Non-urgent messages can be sent to your provider as well.   To learn more about what you can do with MyChart, go to [email or web address].    Your next appointment:  IN JULY 2 month(s)  The format for your next appointment:   In Person  Provider:   Shelva Majestic, MD       Signed, Shelva Majestic, MD  09/28/2019 4:15 PM    [facility] [address], [place], [place], [place]  [address] Phone: [phone]  [phone]

## 2019-09-28 ENCOUNTER — Encounter: Payer: Self-pay | Admitting: Cardiovascular Disease

## 2019-10-03 ENCOUNTER — Other Ambulatory Visit: Payer: Self-pay | Admitting: Cardiovascular Disease

## 2019-10-04 ENCOUNTER — Other Ambulatory Visit: Payer: Self-pay | Admitting: Cardiovascular Disease

## 2019-10-06 LAB — BASIC METABOLIC PANEL
BUN/Creatinine Ratio: 21 — ABNORMAL HIGH (ref 9–20)
BUN: 25 mg/dL — ABNORMAL HIGH (ref 6–24)
CO2: 23 mmol/L (ref 20–29)
Calcium: 9.8 mg/dL (ref 8.7–10.2)
Chloride: 99 mmol/L (ref 96–106)
Creatinine, Ser: 1.17 mg/dL (ref 0.76–1.27)
GFR calc Af Amer: 80 mL/min/{1.73_m2} (ref >59)
GFR calc non Af Amer: 69 mL/min/{1.73_m2} (ref >59)
Glucose: 82 mg/dL (ref 65–99)
Potassium: 4.5 mmol/L (ref 3.5–5.2)
Sodium: 141 mmol/L (ref 134–144)

## 2019-10-06 LAB — PRO B NATRIURETIC PEPTIDE: NT-Pro BNP: 172 pg/mL (ref 0–210)

## 2019-12-21 ENCOUNTER — Other Ambulatory Visit: Payer: Self-pay

## 2019-12-21 ENCOUNTER — Encounter: Payer: Self-pay | Admitting: Cardiovascular Disease

## 2019-12-21 ENCOUNTER — Ambulatory Visit (INDEPENDENT_AMBULATORY_CARE_PROVIDER_SITE_OTHER): Payer: Managed Care, Other (non HMO) | Admitting: Cardiovascular Disease

## 2019-12-21 VITALS — BP 128/72 | Temp 98.3°F | Ht 67.0 in | Wt 193.0 lb

## 2019-12-21 DIAGNOSIS — I255 Ischemic cardiomyopathy: Secondary | ICD-10-CM | POA: Diagnosis not present

## 2019-12-21 DIAGNOSIS — I5020 Unspecified systolic (congestive) heart failure: Secondary | ICD-10-CM

## 2019-12-21 DIAGNOSIS — Z720 Tobacco use: Secondary | ICD-10-CM

## 2019-12-21 DIAGNOSIS — I2102 ST elevation (STEMI) myocardial infarction involving left anterior descending coronary artery: Secondary | ICD-10-CM

## 2019-12-21 DIAGNOSIS — E785 Hyperlipidemia, unspecified: Secondary | ICD-10-CM

## 2019-12-21 NOTE — Patient Instructions (Signed)
Medication Instructions:  CONTINUE WITH CURRENT MEDICATIONS. NO CHANGES.  *If you need a refill on your cardiac medications before your next appointment, please call your pharmacy*   Lab Work: FASTING: CMET CBC TSH LIPID PROBNP  If you have labs (blood work) drawn today and your tests are completely normal, you will receive your results only by: Marland Kitchen MyChart Message (if you have MyChart) OR . A paper copy in the mail If you have any lab test that is abnormal or we need to change your treatment, we will call you to review the results.   Follow-Up: At [facility], you and your health needs are our priority.  As part of our continuing mission to provide you with exceptional heart care, we have created designated Provider Care Teams.  These Care Teams include your primary Cardiologist (physician) and Advanced Practice Providers (APPs -  Physician Assistants and Nurse Practitioners) who all work together to provide you with the care you need, when you need it.  We recommend signing up for the patient portal called "MyChart".  Sign up information is provided on this After Visit Summary.  MyChart is used to connect with patients for Virtual Visits (Telemedicine).  Patients are able to view lab/test results, encounter notes, upcoming appointments, etc.  Non-urgent messages can be sent to your provider as well.   To learn more about what you can do with MyChart, go to [email or web address].    Your next appointment:   6 month(s)  The format for your next appointment:   In Person  Provider:   Nicki Guadalajara, MD

## 2019-12-21 NOTE — Progress Notes (Signed)
Cardiology Office Note    Date:  12/26/2019   ID:  Nasier Thumm, DOB [birth year], MRN [id number]  PCP:  Patient, No Pcp Per  Cardiologist:  Shelva Majestic, MD   F/u  office visit   History of Present Illness:  Callahan Wild is a 59 y.o. male who presents for a 3 month follow-up cardiology evaluation   Mr. Obinna Ehresman is  originally from the [place] and has been living in the [place] for approximately 10 years.  On February 10, 2019 he developed new onset chest pain, and ECG showed ST elevation in V5 and V6 and a code STEMI was activated.  He underwent emergent cardiac catheterization and intervention by me and was found to have total occlusion of the proximal LAD.  There was 40% first marginal stenosis and he had a dominant RCA with luminal regularity.  He underwent successful emergent PCI to his LAD with ultimate insertion of a 3.0 x 32 mm Synergy DES stent postdilated to 3.3-3.25 taper with stenosis improved to 0% and resumption of TIMI-3 flow.  An in-hospital echo Doppler study on February 10, 2019 showed EF of 35 to 40%.   He was subsequently evaluated in the office on February 23, 2019 by Almyra Deforest.  At that time he was stable without recurrent chest pain.  He was on aspirin/Brilinta for DAPT.  Losartan 25 mg was added to his regimen of Toprol-XL 25 mg.  He has tolerated that well.  He has continued to be on atorvastatin 80 mg.  He was referred for follow-up echo Doppler study which was recently completed on June 21, 1999.  EF remains low at 30 to 35% and there was grade 1 diastolic dysfunction.  There were regional wall motion abnormalities.  I saw him for his initial evaluation with me in the office on July 05, 2019.  At that time he denied any recurrent chest pain PND orthopnea or palpitations.  Historically, he had started smoking as a teenager and after smoking for many years had quit smoking for over 23 years.  Unfortunately he had restarted smoking 10 years ago.  He had been  smoking up to 2 packs/day and since his myocardial infarction he has significantly reduced his smoking to less than 1 pack/day for continues to smoke.  When his initial evaluation, with his reduced LV function I recommended transition to [facility] and provided him with samples of 24/26 mg twice a day 2 weeks and and recommended he discontinue losartan.  I recommended follow-up with our pharmacy clinic.  When I saw him in follow-up in May 2021 he was on Entresto 49/51 mg twice a day, Toprol-XL 25 mg in addition to aspirin and Brilinta and atorvastatin 80 mg.  He has felt well.  He denies chest pain or shortness of breath.  He wasreducing his cigarettes but unfortunately is still smoking.  He tells me he is contemplating a possible job promotion which may require him returning back to [place].  Since I last saw him in May 2021, he has subsequently turned down the job change to return to the [place].  He has continued to be asymptomatic without any heart failure symptoms and continues to be on Entresto 49/51 mg twice a day, metoprolol succinate 25 mg daily, spironolactone 12.5 mg.  He continues to be on DAPT with aspirin/ticagrelor 90 mg twice a day.  He feels well.  He denies shortness of breath.  He denies PND orthopnea.  Laboratory in May showed a  proBNP that was normal at 172.  Creatinine had improved to 1.17.  Potassium was 4.5.  His insurance at work to change and there has been some confusion with reference to his deductible.  He was told that it would now cost him $500 per month.  He has reduced tobacco but unfortunately is still smoking some.  He presents for evaluation  Past Medical History:  Diagnosis Date  . Asthma     Past Surgical History:  Procedure Laterality Date  . CORONARY STENT INTERVENTION N/A 02/10/2019   Procedure: CORONARY STENT INTERVENTION;  Surgeon: Troy Sine, MD;  Location: [facility];  Service: Cardiovascular;  Laterality: N/A;  . CORONARY/GRAFT  ACUTE MI REVASCULARIZATION N/A 02/10/2019   Procedure: Coronary/Graft Acute MI Revascularization;  Surgeon: Troy Sine, MD;  Location: [place];  Service: Cardiovascular;  Laterality: N/A;  . LEFT HEART CATH AND CORONARY ANGIOGRAPHY N/A 02/10/2019   Procedure: LEFT HEART CATH AND CORONARY ANGIOGRAPHY;  Surgeon: Troy Sine, MD;  Location: [facility];  Service: Cardiovascular;  Laterality: N/A;  . NECK SURGERY      Current Medications: Outpatient Medications Prior to Visit  Medication Sig Dispense Refill  . albuterol (VENTOLIN HFA) 108 (90 Base) MCG/ACT inhaler Inhale 1-2 puffs into the lungs every 6 (six) hours as needed for wheezing or shortness of breath.    Marland Kitchen aspirin 81 MG chewable tablet Chew 1 tablet (81 mg total) by mouth daily. 30 tablet 12  . atorvastatin (LIPITOR) 80 MG tablet TAKE 1 TABLET (80 MG TOTAL) BY MOUTH DAILY AT 6 PM. 30 tablet 5  . metoprolol succinate (TOPROL-XL) 25 MG 24 hr tablet Take 1 tablet (25 mg total) by mouth daily. 90 tablet 1  . nitroGLYCERIN (NITROSTAT) 0.4 MG SL tablet Place 1 tablet (0.4 mg total) under the tongue every 5 (five) minutes x 3 doses as needed for chest pain. 25 tablet 12  . sacubitril-valsartan (ENTRESTO) 49-51 MG Take 1 tablet by mouth 2 (two) times daily. 60 tablet 5  . spironolactone (ALDACTONE) 25 MG tablet Take 0.5 tablets (12.5 mg total) by mouth daily. 45 tablet 3  . ticagrelor (BRILINTA) 90 MG TABS tablet Take 1 tablet (90 mg total) by mouth 2 (two) times daily. 60 tablet 12   No facility-administered medications prior to visit.     Allergies:   Patient has no known allergies.   Social History   Socioeconomic History  . Marital status: Married    Spouse name: Not on file  . Number of children: Not on file  . Years of education: Not on file  . Highest education level: Not on file  Occupational History  . Not on file  Tobacco Use  . Smoking status: Current Every Day Smoker  . Smokeless tobacco: Never  Used  Substance and Sexual Activity  . Alcohol use: Yes  . Drug use: Never  . Sexual activity: Not on file  Other Topics Concern  . Not on file  Social History Narrative  . Not on file   Social Determinants of Health   Financial Resource Strain:   . Difficulty of Paying Living Expenses:   Food Insecurity:   . Worried About Charity fundraiser in the Last Year:   . Arboriculturist in the Last Year:   Transportation Needs:   . Film/video editor (Medical):   Marland Kitchen Lack of Transportation (Non-Medical):   Physical Activity:   . Days of Exercise per Week:   .  Minutes of Exercise per Session:   Stress:   . Feeling of Stress :   Social Connections:   . Frequency of Communication with Friends and Family:   . Frequency of Social Gatherings with Friends and Family:   . Attends Religious Services:   . Active Member of Clubs or Organizations:   . Attends Archivist Meetings:   Marland Kitchen Marital Status:     Socially he was born in the [place].  He moved to [place] approximately 10 years ago.  Over the last several years due to his job he moved to the [place] area.  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  Family History: Family history is negative for heart disease  ROS General: Negative; No fevers, chills, or night sweats;  HEENT: Negative; No changes in vision or hearing, sinus congestion, difficulty swallowing Pulmonary: Negative; No cough, wheezing, shortness of breath, hemoptysis Cardiovascular: Negative; No chest pain, presyncope, syncope, palpitations GI: Negative; No nausea, vomiting, diarrhea, or abdominal pain GU: Negative; No dysuria, hematuria, or difficulty voiding Musculoskeletal: Negative; no myalgias, joint pain, or weakness Hematologic/Oncology: Negative; no easy bruising, bleeding Endocrine: Negative; no heat/cold intolerance; no diabetes Neuro: Negative; no changes in balance,  headaches Skin: Negative; No rashes or skin lesions Psychiatric: Negative; No behavioral problems, depression Sleep: Negative; No snoring, daytime sleepiness, hypersomnolence, bruxism, restless legs, hypnogognic hallucinations, no cataplexy Other comprehensive 14 point system review is negative.   PHYSICAL EXAM:   VS:  BP 128/72   Temp 98.3 F (36.8 C)   Ht '5\' 7"'  (1.702 m)   Wt 193 lb (87.5 kg)   SpO2 98%   BMI 30.23 kg/m     Repeat blood pressure by me was 120/72  Wt Readings from Last 3 Encounters:  12/21/19 193 lb (87.5 kg)  09/26/19 193 lb 9.6 oz (87.8 kg)  07/19/19 189 lb 4.8 oz (85.9 kg)    General: Alert, oriented, no distress.  Skin: normal turgor, no rashes, warm and dry HEENT: Normocephalic, atraumatic. Pupils equal round and reactive to light; sclera anicteric; extraocular muscles intact;  Nose without nasal septal hypertrophy Mouth/Parynx benign; Mallinpatti scale 3 Neck: No JVD, no carotid bruits; normal carotid upstroke Lungs: clear to ausculatation and percussion; no wheezing or rales Chest wall: without tenderness to palpitation Heart: PMI not displaced, RRR, s1 s2 normal, 1/6 systolic murmur, no diastolic murmur, no rubs, gallops, thrills, or heaves Abdomen: soft, nontender; no hepatosplenomehaly, BS+; abdominal aorta nontender and not dilated by palpation. Back: no CVA tenderness Pulses 2+ Musculoskeletal: full range of motion, normal strength, no joint deformities Extremities: no clubbing cyanosis or edema, Homan's sign negative  Neurologic: grossly nonfocal; Cranial nerves grossly wnl Psychologic: Normal mood and affect   Studies/Labs Reviewed:     ECG (independently read by me): Normal sinus rhythm at 75 bpm.  QS complex V1 through V4.  No ectopy.  Normal intervals.  May 2021  ECG (independently read by me): Normal sinus rhythm at 77 bpm.  Probable biatrial enlargement.  QS complex V1 through V4 consistent with his anterior wall myocardial  infarction.  Normal intervals.  Recent Labs: BMP Latest Ref Rng & Units 10/05/2019 08/01/2019 07/18/2019  Glucose 65 - 99 mg/dL 82 76 93  BUN 6 - 24 mg/dL 25(H) 28(H) 19  Creatinine 0.76 - 1.27 mg/dL 1.17 1.32(H) 1.24  BUN/Creat Ratio 9 - 20 21(H) 21(H) 15  Sodium 134 - 144 mmol/L 141 141 142  Potassium 3.5 - 5.2 mmol/L 4.5 4.5 4.5  Chloride 96 - 106 mmol/L 99 104 104  CO2 20 - 29 mmol/L '23 21 25  ' Calcium 8.7 - 10.2 mg/dL 9.8 9.4 9.4     Hepatic Function Latest Ref Rng & Units 07/18/2019 03/09/2019  Total Protein 6.0 - 8.5 g/dL 6.8 6.7  Albumin 3.8 - 4.9 g/dL 3.9 4.4  AST 0 - 40 IU/L 20 21  ALT 0 - 44 IU/L 20 25  Alk Phosphatase 39 - 117 IU/L 84 86  Total Bilirubin 0.0 - 1.2 mg/dL 0.2 0.4    CBC Latest Ref Rng & Units 02/11/2019 02/10/2019 02/10/2019  WBC 4.0 - 10.5 K/uL 9.7 - 7.0  Hemoglobin 13.0 - 17.0 g/dL 13.4 13.3 15.3  Hematocrit 39 - 52 % 40.6 39.0 44.9  Platelets 150 - 400 K/uL 194 - 226   Lab Results  Component Value Date   MCV 97.1 02/11/2019   MCV 96.8 02/10/2019   No results found for: TSH No results found for: HGBA1C   BNP No results found for: BNP  ProBNP    Component Value Date/Time   PROBNP 172 10/05/2019 1523     Lipid Panel     Component Value Date/Time   CHOL 83 (L) 03/09/2019 0823   TRIG 55 03/09/2019 0823   HDL 43 03/09/2019 0823   CHOLHDL 1.9 03/09/2019 0823   CHOLHDL 3.8 02/10/2019 0358   VLDL 63 (H) 02/10/2019 0358   LDLCALC 27 03/09/2019 0823   LABVLDL 13 03/09/2019 0823     RADIOLOGY: No results found.   Additional studies/ records that were reviewed today include:  I reviewed the patient's hospitalization records from September 2020.  I have reviewed catheterization findings, echo Doppler study, subsequent evaluation with Almyra Deforest, PA, and most recent echo Doppler evaluation from June 21, 2019.  ASSESSMENT:    1. HFrEF (heart failure with reduced ejection fraction) (Standard)   2. Acute ST elevation myocardial infarction  (STEMI) due to occlusion of proximal portion of left anterior descending (LAD) coronary artery ([place])   3. Ischemic cardiomyopathy   4. Hyperlipidemia with target LDL less than 70   5. Tobacco abuse    PLAN:  Mr. Omere Marti is a 59 year old gentleman  originally from the [place] who denied any prior cardiac history but had a long history of tobacco use.  He presented with an acute coronary syndrome on February 10, 2019 and was found to have total proximal occlusion of a large LAD which was successfully stented.  Subsequently, he has had reduced LV function with EF approximately 35%.  His most recent echo Doppler study despite being on losartan 25 mg and Toprol-XL 25 mg continues to show reduced LV function with EF at 30 to 35% with grade 1 diastolic dysfunction.  There was mild dilation of his left ventricular.  I again  reviewed his catheterization findings and subsequent echo reports with him in detail.  Clinically he feels well.  His blood pressure today is stable.  His ECG is concordant with his echo findings and shows a QS complex in V1 through V4.  At his initial office visit with me in February 2021 losartan was discontinued and he was transition to [place].  When seen in May, he was on 49/51 mg twice daily.  proBNP level drawn on May 14 was normal at 172.  At his last office visit I had recommended the initiation of spironolactone 12.5 mg.  He has tolerated this well.  He  continues to be on metoprolol succinate 25 mg daily.  He is not having any anginal symptoms or shortness of breath.  He is entirely asymptomatic.  He is euvolemic on exam.  Renal function had improved with a follow-up creatinine at 1.17 on Entresto.  I again discussed the importance of smoking cessation.  I also discussed with Raquel our pharmacist who provided him information so that he will have a new card with his new insurance to significantly reduce his deductible for Entresto.  He continues to be on atorvastatin 80 mg  for hyperlipidemia with target LDL cholesterol less than 70.  I will see him in 6 months for follow-up evaluation or sooner if there is recurrent symptomatology.    Medication Adjustments/Labs and Tests Ordered: Current medicines are reviewed at length with the patient today.  Concerns regarding medicines are outlined above.  Medication changes, Labs and Tests ordered today are listed in the Patient Instructions below. Patient Instructions  Medication Instructions:  CONTINUE WITH CURRENT MEDICATIONS. NO CHANGES.  *If you need a refill on your cardiac medications before your next appointment, please call your pharmacy*   Lab Work: FASTING: CMET CBC TSH LIPID PROBNP  If you have labs (blood work) drawn today and your tests are completely normal, you will receive your results only by: Marland Kitchen MyChart Message (if you have MyChart) OR . A paper copy in the mail If you have any lab test that is abnormal or we need to change your treatment, we will call you to review the results.   Follow-Up: At [facility], you and your health needs are our priority.  As part of our continuing mission to provide you with exceptional heart care, we have created designated Provider Care Teams.  These Care Teams include your primary Cardiologist (physician) and Advanced Practice Providers (APPs -  Physician Assistants and Nurse Practitioners) who all work together to provide you with the care you need, when you need it.  We recommend signing up for the patient portal called "MyChart".  Sign up information is provided on this After Visit Summary.  MyChart is used to connect with patients for Virtual Visits (Telemedicine).  Patients are able to view lab/test results, encounter notes, upcoming appointments, etc.  Non-urgent messages can be sent to your provider as well.   To learn more about what you can do with MyChart, go to [email or web address].    Your next appointment:   6 month(s)  The format for your  next appointment:   In Person  Provider:   Shelva Majestic, MD       Signed, Shelva Majestic, MD  12/26/2019 2:58 PM    [facility] [address], [place], [place], [place]  [address] Phone: [phone]  8

## 2019-12-26 ENCOUNTER — Encounter: Payer: Self-pay | Admitting: Cardiovascular Disease

## 2020-02-05 ENCOUNTER — Other Ambulatory Visit: Payer: Self-pay | Admitting: Cardiovascular Disease

## 2020-03-31 ENCOUNTER — Other Ambulatory Visit: Payer: Self-pay | Admitting: Cardiovascular Disease

## 2020-04-02 ENCOUNTER — Other Ambulatory Visit: Payer: Self-pay | Admitting: Cardiovascular Disease

## 2020-04-22 ENCOUNTER — Other Ambulatory Visit: Payer: Self-pay | Admitting: Cardiovascular Disease

## 2020-05-29 ENCOUNTER — Other Ambulatory Visit: Payer: Self-pay

## 2020-05-29 MED ORDER — METOPROLOL SUCCINATE ER 25 MG PO TB24
25.0000 mg | ORAL_TABLET | Freq: Every day | ORAL | 1 refills | Status: DC
Start: 2020-05-29 — End: 2020-11-21

## 2020-06-05 ENCOUNTER — Other Ambulatory Visit: Payer: Self-pay

## 2020-06-05 MED ORDER — ATORVASTATIN CALCIUM 80 MG PO TABS
80.0000 mg | ORAL_TABLET | Freq: Every day | ORAL | 0 refills | Status: DC
Start: 2020-06-05 — End: 2020-10-28

## 2020-07-10 DIAGNOSIS — I2102 ST elevation (STEMI) myocardial infarction involving left anterior descending coronary artery: Secondary | ICD-10-CM | POA: Diagnosis not present

## 2020-07-10 DIAGNOSIS — E785 Hyperlipidemia, unspecified: Secondary | ICD-10-CM | POA: Diagnosis not present

## 2020-07-10 DIAGNOSIS — I5020 Unspecified systolic (congestive) heart failure: Secondary | ICD-10-CM | POA: Diagnosis not present

## 2020-07-10 DIAGNOSIS — I255 Ischemic cardiomyopathy: Secondary | ICD-10-CM | POA: Diagnosis not present

## 2020-07-11 LAB — CBC
Hematocrit: 39.6 % (ref 37.5–51.0)
Hemoglobin: 13.3 g/dL (ref 13.0–17.7)
MCH: 32.3 pg (ref 26.6–33.0)
MCHC: 33.6 g/dL (ref 31.5–35.7)
MCV: 96 fL (ref 79–97)
Platelets: 247 10*3/uL (ref 150–450)
RBC: 4.12 x10E6/uL — ABNORMAL LOW (ref 4.14–5.80)
RDW: 12.6 % (ref 11.6–15.4)
WBC: 6.2 10*3/uL (ref 3.4–10.8)

## 2020-07-11 LAB — COMPREHENSIVE METABOLIC PANEL
ALT: 28 IU/L (ref 0–44)
AST: 29 IU/L (ref 0–40)
Albumin/Globulin Ratio: 1.7 (ref 1.2–2.2)
Albumin: 4.4 g/dL (ref 3.8–4.9)
Alkaline Phosphatase: 70 IU/L (ref 44–121)
BUN/Creatinine Ratio: 19 (ref 9–20)
BUN: 20 mg/dL (ref 6–24)
Bilirubin Total: 0.4 mg/dL (ref 0.0–1.2)
CO2: 21 mmol/L (ref 20–29)
Calcium: 9.5 mg/dL (ref 8.7–10.2)
Chloride: 101 mmol/L (ref 96–106)
Creatinine, Ser: 1.08 mg/dL (ref 0.76–1.27)
GFR calc Af Amer: 87 mL/min/{1.73_m2} (ref >59)
GFR calc non Af Amer: 75 mL/min/{1.73_m2} (ref >59)
Globulin, Total: 2.6 g/dL (ref 1.5–4.5)
Glucose: 89 mg/dL (ref 65–99)
Potassium: 4.9 mmol/L (ref 3.5–5.2)
Sodium: 138 mmol/L (ref 134–144)
Total Protein: 7 g/dL (ref 6.0–8.5)

## 2020-07-11 LAB — LIPID PANEL
Chol/HDL Ratio: 2 ratio (ref 0.0–5.0)
Cholesterol, Total: 100 mg/dL (ref 100–199)
HDL: 50 mg/dL (ref >39)
LDL Chol Calc (NIH): 37 mg/dL (ref 0–99)
Triglycerides: 57 mg/dL (ref 0–149)
VLDL Cholesterol Cal: 13 mg/dL (ref 5–40)

## 2020-07-11 LAB — TSH: TSH: 1.81 u[IU]/mL (ref 0.450–4.500)

## 2020-07-11 LAB — PRO B NATRIURETIC PEPTIDE: NT-Pro BNP: 166 pg/mL (ref 0–210)

## 2020-07-14 ENCOUNTER — Other Ambulatory Visit: Payer: Self-pay

## 2020-07-14 ENCOUNTER — Ambulatory Visit (INDEPENDENT_AMBULATORY_CARE_PROVIDER_SITE_OTHER): Payer: BC Managed Care – PPO | Admitting: Cardiovascular Disease

## 2020-07-14 ENCOUNTER — Encounter: Payer: Self-pay | Admitting: Cardiovascular Disease

## 2020-07-14 VITALS — BP 122/68 | HR 75 | Ht 67.0 in | Wt 195.4 lb

## 2020-07-14 DIAGNOSIS — I255 Ischemic cardiomyopathy: Secondary | ICD-10-CM

## 2020-07-14 DIAGNOSIS — I5020 Unspecified systolic (congestive) heart failure: Secondary | ICD-10-CM

## 2020-07-14 NOTE — Patient Instructions (Signed)
Medication Instructions:  Your physician recommends that you continue on your current medications as directed. Please refer to the Current Medication list given to you today.   *If you need a refill on your cardiac medications before your next appointment, please call your pharmacy*  Lab Work: NONE   Testing/Procedures: Your physician has requested that you have an echocardiogram. Echocardiography is a painless test that uses sound waves to create images of your heart. It provides your doctor with information about the size and shape of your heart and how well your heart's chambers and valves are working. This procedure takes approximately one hour. There are no restrictions for this procedure. 1-2 MONTHS   Follow-Up: At [facility], you and your health needs are our priority.  As part of our continuing mission to provide you with exceptional heart care, we have created designated Provider Care Teams.  These Care Teams include your primary Cardiologist (physician) and Advanced Practice Providers (APPs -  Physician Assistants and Nurse Practitioners) who all work together to provide you with the care you need, when you need it.  We recommend signing up for the patient portal called "MyChart".  Sign up information is provided on this After Visit Summary.  MyChart is used to connect with patients for Virtual Visits (Telemedicine).  Patients are able to view lab/test results, encounter notes, upcoming appointments, etc.  Non-urgent messages can be sent to your provider as well.   To learn more about what you can do with MyChart, go to [email or web address].    Your next appointment:   6 month(s)  The format for your next appointment:   In Person  Provider:   You may see Nicki Guadalajara, MD   or one of the following Advanced Practice Providers on your designated Care Team:    Azalee Course, PA-C  Micah Flesher, PA-C or   Judy Pimple, [place]

## 2020-07-14 NOTE — Progress Notes (Signed)
 Cardiology Office Note    Date:  07/14/2020   ID:  Cory Berry, DOB 10/14/1960, MRN [id number]  PCP:  Patient, No Pcp Per  Cardiologist:  Dobrowolski Kelly, MD   F/u  office visit   History of Present Illness:  Cory Berry is a 59 y.o. male who presents for a 7 month follow-up cardiology evaluation   Cory Berry is  originally from the [place] and has been living in the [place] 10 [place].  On February 10, 2019 he developed new onset chest pain, and ECG showed ST elevation in V5 and V6 and a code STEMI was activated.  He underwent emergent cardiac catheterization and intervention by me and was found to have total occlusion of the proximal LAD.  There was 40% first marginal stenosis and he had a dominant RCA with luminal regularity.  He underwent successful emergent PCI to his LAD with ultimate insertion of a 3.0 x 32 mm Synergy DES stent postdilated to 3.3-3.25 taper with stenosis improved to 0% and resumption of TIMI-3 flow.  An in-hospital echo Doppler study on February 10, 2019 showed EF of 35 to 40%.   He was subsequently evaluated in the office on February 23, 2019 by Cory Berry.  At that time he was stable without recurrent chest pain.  He was on aspirin/Brilinta for DAPT.  Losartan 25 mg was added to his regimen of Toprol-XL 25 mg.  He has tolerated that well.  He has continued to be on atorvastatin 80 mg.  He was referred for follow-up echo Doppler study which was recently completed on June 21, 1999.  EF remains low at 30 to 35% and there was grade 1 diastolic dysfunction.  There were regional wall motion abnormalities.  I saw him for his initial evaluation with me in the office on July 05, 2019.  At that time he denied any recurrent chest pain PND orthopnea or palpitations.  Historically, he had started smoking as a teenager and after smoking for many years had quit smoking for over 23 years.  Unfortunately he had restarted smoking 10 years ago.  He had been  smoking up to 2 packs/day and since his myocardial infarction he has significantly reduced his smoking to less than 1 pack/day for continues to smoke.  When his initial evaluation, with his reduced LV function I recommended transition to Entresto and provided him with samples of 24/26 mg twice a day 2 weeks and and recommended he discontinue losartan.  I recommended follow-up with our pharmacy clinic.  When I saw him in follow-up in May 2021 he was on Entresto 49/51 mg twice a day, Toprol-XL 25 mg in addition to aspirin and Brilinta and atorvastatin 80 mg.  He has felt well.  He denies chest pain or shortness of breath.  He wasreducing his cigarettes but unfortunately is still smoking.  He tells me he is contemplating a possible job promotion which may require him returning back to [place].  I last saw him on December 21, 2019 and seems his prior evaluation he  turned down the job change to return to the [place].  He  continued to be asymptomatic without any heart failure symptoms and continues to be on Entresto 49/51 mg twice a day, metoprolol succinate 25 mg daily, spironolactone 12.5 mg.  He continued to be on DAPT with aspirin/ticagrelor 90 mg twice a day.  He felt well and denied any shortness of breath, PND or orthopnea.   Laboratory in   May showed a proBNP that was normal at 172.  Creatinine had improved to 1.17.  Potassium was 4.5.  His insurance at work to change and there has been some confusion with reference to his deductible.  He was told that it would now cost him $500 per month.  He has reduced tobacco but unfortunately is still smoking some.  During that evaluation, we were able to provide him with the information so that he would have a new card with his new insurance to significantly reduce his deductible for Entresto.  He continued to be on atorvastatin 80 mg for hyperlipidemia.  Since I last saw him, he has continued to be asymptomatic.  He denies any shortness of breath or chest  pain or palpitations.  He continues to be on aspirin/Brilinta without bleeding.  He is on Entresto 49/51 mg twice a day, metoprolol succinate 25 mg and spironolactone 12.5 mg daily.  He is on atorvastatin 80 mg.  He presents for reevaluation.  Past Medical History:  Diagnosis Date  . Asthma     Past Surgical History:  Procedure Laterality Date  . CORONARY STENT INTERVENTION N/A 02/10/2019   Procedure: CORONARY STENT INTERVENTION;  Surgeon: Cory Sine, MD;  Location: [facility];  Service: Cardiovascular;  Laterality: N/A;  . CORONARY/GRAFT ACUTE MI REVASCULARIZATION N/A 02/10/2019   Procedure: Coronary/Graft Acute MI Revascularization;  Surgeon: Cory Sine, MD;  Location: [facility];  Service: Cardiovascular;  Laterality: N/A;  . LEFT HEART CATH AND CORONARY ANGIOGRAPHY N/A 02/10/2019   Procedure: LEFT HEART CATH AND CORONARY ANGIOGRAPHY;  Surgeon: Cory Sine, MD;  Location: [facility];  Service: Cardiovascular;  Laterality: N/A;  . NECK SURGERY      Current Medications: Outpatient Medications Prior to Visit  Medication Sig Dispense Refill  . albuterol (VENTOLIN HFA) 108 (90 Base) MCG/ACT inhaler Inhale 1-2 puffs into the lungs every 6 (six) hours as needed for wheezing or shortness of breath.    Marland Kitchen aspirin 81 MG chewable tablet Chew 1 tablet (81 mg total) by mouth daily. 30 tablet 12  . atorvastatin (LIPITOR) 80 MG tablet Take 1 tablet (80 mg total) by mouth daily at 6 PM. 60 tablet 0  . BRILINTA 90 MG TABS tablet TAKE 1 TABLET BY MOUTH TWICE A DAY 60 tablet 5  . ENTRESTO 49-51 MG TAKE 1 TABLET BY MOUTH 2 (TWO) TIMES DAILY. 60 tablet 6  . metoprolol succinate (TOPROL-XL) 25 MG 24 hr tablet Take 1 tablet (25 mg total) by mouth daily. 90 tablet 1  . nitroGLYCERIN (NITROSTAT) 0.4 MG SL tablet Place 1 tablet (0.4 mg total) under the tongue every 5 (five) minutes x 3 doses as needed for chest pain. 25 tablet 12  . spironolactone (ALDACTONE) 25 MG tablet Take  0.5 tablets (12.5 mg total) by mouth daily. 45 tablet 3   No facility-administered medications prior to visit.     Allergies:   Patient has no known allergies.   Social History   Socioeconomic History  . Marital status: Married    Spouse name: Not on file  . Number of children: Not on file  . Years of education: Not on file  . Highest education level: Not on file  Occupational History  . Not on file  Tobacco Use  . Smoking status: Current Every Day Smoker  . Smokeless tobacco: Never Used  Substance and Sexual Activity  . Alcohol use: Yes  . Drug use: Never  . Sexual  activity: Not on file  Other Topics Concern  . Not on file  Social History Narrative  . Not on file   Social Determinants of Health   Financial Resource Strain: Not on file  Food Insecurity: Not on file  Transportation Needs: Not on file  Physical Activity: Not on file  Stress: Not on file  Social Connections: Not on file    Socially he was born in the [place].  He moved to [place] approximately 10 years ago.  Over the last several years due to his job he moved to the [place] area.  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  Family History: Family history is negative for heart disease  ROS General: Negative; No fevers, chills, or night sweats;  HEENT: Negative; No changes in vision or hearing, sinus congestion, difficulty swallowing Pulmonary: Negative; No cough, wheezing, shortness of breath, hemoptysis Cardiovascular: Negative; No chest pain, presyncope, syncope, palpitations GI: Negative; No nausea, vomiting, diarrhea, or abdominal pain GU: Negative; No dysuria, hematuria, or difficulty voiding Musculoskeletal: Negative; no myalgias, joint pain, or weakness Hematologic/Oncology: Negative; no easy bruising, bleeding Endocrine: Negative; no heat/cold intolerance; no diabetes Neuro: Negative; no changes in balance, headaches Skin:  Negative; No rashes or skin lesions Psychiatric: Negative; No behavioral problems, depression Sleep: Negative; No snoring, daytime sleepiness, hypersomnolence, bruxism, restless legs, hypnogognic hallucinations, no cataplexy Other comprehensive 14 point system review is negative.   PHYSICAL EXAM:   VS:  BP 122/68   Pulse 75   Ht 5' 7" (1.702 m)   Wt 195 lb 6.4 oz (88.6 kg)   SpO2 98%   BMI 30.60 kg/m     Repeat blood pressure by me was 122/70  Wt Readings from Last 3 Encounters:  07/14/20 195 lb 6.4 oz (88.6 kg)  12/21/19 193 lb (87.5 kg)  09/26/19 193 lb 9.6 oz (87.8 kg)    General: Alert, oriented, no distress.  Skin: normal turgor, no rashes, warm and dry HEENT: Normocephalic, atraumatic. Pupils equal round and reactive to light; sclera anicteric; extraocular muscles intact;  Nose without nasal septal hypertrophy Mouth/Parynx benign; Mallinpatti scale 3 Neck: No JVD, no carotid bruits; normal carotid upstroke Lungs: clear to ausculatation and percussion; no wheezing or rales Chest wall: without tenderness to palpitation Heart: PMI not displaced, RRR, s1 s2 normal, 1/6 systolic murmur, no diastolic murmur, no rubs, gallops, thrills, or heaves Abdomen: soft, nontender; no hepatosplenomehaly, BS+; abdominal aorta nontender and not dilated by palpation. Back: no CVA tenderness Pulses 2+ Musculoskeletal: full range of motion, normal strength, no joint deformities Extremities: no clubbing cyanosis or edema, Homan's sign negative  Neurologic: grossly nonfocal; Cranial nerves grossly wnl Psychologic: Normal mood and affect   Studies/Labs Reviewed:    ECG (independently read by me): NSR at 75, QS V1-3, no ectopy  December 21, 2019 ECG (independently read by me): Normal sinus rhythm at 75 bpm.  QS complex V1 through V4.  No ectopy.  Normal intervals.  May 2021  ECG (independently read by me): Normal sinus rhythm at 77 bpm.  Probable biatrial enlargement.  QS complex V1 through V4  consistent with his anterior wall myocardial infarction.  Normal intervals.  Recent Labs: BMP Latest Ref Rng & Units 07/10/2020 10/05/2019 08/01/2019  Glucose 65 - 99 mg/dL 89 82 76  BUN 6 - 24 mg/dL 20 25(H) 28(H)  Creatinine 0.76 - 1.27 mg/dL 1.08 1.17 1.32(H)  BUN/Creat Ratio 9 - 20 19 21(H)  21(H)  Sodium 134 - 144 mmol/L 138 141 141  Potassium 3.5 - 5.2 mmol/L 4.9 4.5 4.5  Chloride 96 - 106 mmol/L 101 99 104  CO2 20 - 29 mmol/L _0 Calcium 8.7 - 10.2 mg/dL 9.5 9.8 9.4     Hepatic Function Latest Ref Rng & Units 07/10/2020 07/18/2019 03/09/2019  Total Protein 6.0 - 8.5 g/dL 7.0 6.8 6.7  Albumin 3.8 - 4.9 g/dL 4.4 3.9 4.4  AST 0 - 40 IU/L _1 ALT 0 - 44 IU/L _2 Alk Phosphatase 44 - 121 IU/L 70 84 86  Total Bilirubin 0.0 - 1.2 mg/dL 0.4 0.2 0.4    CBC Latest Ref Rng & Units 07/10/2020 02/11/2019 02/10/2019  WBC 3.4 - 10.8 x10E3/uL 6.2 9.7 -  Hemoglobin 13.0 - 17.7 g/dL 13.3 13.4 13.3  Hematocrit 37.5 - 51.0 % 39.6 40.6 39.0  Platelets 150 - 450 x10E3/uL 247 194 -   Lab Results  Component Value Date   MCV 96 07/10/2020   MCV 97.1 02/11/2019   MCV 96.8 02/10/2019   Lab Results  Component Value Date   TSH 1.810 07/10/2020   No results found for: HGBA1C   BNP No results found for: BNP  ProBNP    Component Value Date/Time   PROBNP 166 07/10/2020 0807     Lipid Panel     Component Value Date/Time   CHOL 100 07/10/2020 0807   TRIG 57 07/10/2020 0807   HDL 50 07/10/2020 0807   CHOLHDL 2.0 07/10/2020 0807   CHOLHDL 3.8 02/10/2019 0358   VLDL 63 (H) 02/10/2019 0358   LDLCALC 37 07/10/2020 0807   LABVLDL 13 07/10/2020 0807     RADIOLOGY: No results found.   Additional studies/ records that were reviewed today include:  I reviewed the patient's hospitalization records from September 2020.  I have reviewed catheterization findings, echo Doppler study, subsequent evaluation with Cory Deforest, Cory Berry, and most recent echo Doppler evaluation from June 21, 2019.  ASSESSMENT:    1. HFrEF (heart failure with reduced ejection fraction) ([place])   2. Ischemic cardiomyopathy    PLAN:  Cory Berry is a 60 year old gentleman originally from the [place] who denied any prior cardiac history but had a long history of tobacco use.  He presented with an acute coronary syndrome on February 10, 2019 and was found to have total proximal occlusion of a large LAD which was successfully stented.  Subsequently, he has had reduced LV function with EF approximately 35%.  His last echo Doppler study was done in January 2021 and despite being on losartan 25 mg and Toprol-XL 25 mg continued to show reduced LV function with EF at 30 to 35% with grade 1 diastolic dysfunction.  There was mild dilation of his left ventricular.  He has successfully been transitioned to [facility] and remains euvolemic on 49/51 mg twice daily in addition to metoprolol succinate 25 mg and spironolactone 12.5 mg daily.  proBNP done today continues to show normal level at 166.  He has not had an echo Doppler study since Entresto initiation.  I have recommended that over the next several months a repeat echo Doppler study be obtained for reassessment of LV size, chamber dimensions and function.  He continues to be on DAPT without bleeding.  He is on aggressive lipid-lowering therapy.  With target LDL less than 70.  I strongly recommended complete discontinuance of tobacco.  He will be going to the [place]  [place] for a family wedding in the upcoming months.  I will see him in 6 months for reevaluation.   Medication Adjustments/Labs and Tests Ordered: Current medicines are reviewed at length with the patient today.  Concerns regarding medicines are outlined above.  Medication changes, Labs and Tests ordered today are listed in the Patient Instructions below. Patient Instructions  Medication Instructions:  Your physician recommends that you continue on your current medications as directed.  Please refer to the Current Medication list given to you today.   *If you need a refill on your cardiac medications before your next appointment, please call your pharmacy*  Lab Work: NONE   Testing/Procedures: Your physician has requested that you have an echocardiogram. Echocardiography is a painless test that uses sound waves to create images of your heart. It provides your doctor with information about the size and shape of your heart and how well your heart's chambers and valves are working. This procedure takes approximately one hour. There are no restrictions for this procedure. 1-2 MONTHS   Follow-Up: At [facility], you and your health needs are our priority.  As part of our continuing mission to provide you with exceptional heart care, we have created designated Provider Care Teams.  These Care Teams include your primary Cardiologist (physician) and Advanced Practice Providers (APPs -  Physician Assistants and Nurse Practitioners) who all work together to provide you with the care you need, when you need it.  We recommend signing up for the patient portal called "MyChart".  Sign up information is provided on this After Visit Summary.  MyChart is used to connect with patients for Virtual Visits (Telemedicine).  Patients are able to view lab/test results, encounter notes, upcoming appointments, etc.  Non-urgent messages can be sent to your provider as well.   To learn more about what you can do with MyChart, go to [email or web address].    Your next appointment:   6 month(s)  The format for your next appointment:   In Person  Provider:   You may see Shelva Majestic, MD   or one of the following Advanced Practice Providers on your designated Care Team:    Cory Deforest, Cory Berry-C  Fabian Sharp, Cory Berry-C or   Roby Lofts, [place]        Signed, Shelva Majestic, MD  07/14/2020 6:24 PM    [facility] [address], [place], [place], [place]  [address] Phone:  [phone]

## 2020-07-15 ENCOUNTER — Encounter: Payer: Self-pay | Admitting: Cardiovascular Disease

## 2020-07-30 DIAGNOSIS — L0291 Cutaneous abscess, unspecified: Secondary | ICD-10-CM | POA: Diagnosis not present

## 2020-08-05 ENCOUNTER — Other Ambulatory Visit: Payer: Self-pay | Admitting: Cardiovascular Disease

## 2020-08-09 DIAGNOSIS — Z4802 Encounter for removal of sutures: Secondary | ICD-10-CM | POA: Diagnosis not present

## 2020-09-01 ENCOUNTER — Other Ambulatory Visit: Payer: Self-pay | Admitting: Cardiovascular Disease

## 2020-09-11 ENCOUNTER — Other Ambulatory Visit (HOSPITAL_COMMUNITY): Payer: BC Managed Care – PPO

## 2020-10-09 ENCOUNTER — Ambulatory Visit (HOSPITAL_COMMUNITY): Payer: BC Managed Care – PPO | Attending: Cardiology

## 2020-10-09 ENCOUNTER — Other Ambulatory Visit: Payer: Self-pay

## 2020-10-09 DIAGNOSIS — I255 Ischemic cardiomyopathy: Secondary | ICD-10-CM | POA: Insufficient documentation

## 2020-10-09 DIAGNOSIS — I5020 Unspecified systolic (congestive) heart failure: Secondary | ICD-10-CM | POA: Diagnosis not present

## 2020-10-09 LAB — ECHOCARDIOGRAM COMPLETE
Area-P 1/2: 4.08 cm2
P 1/2 time: 445 msec
S' Lateral: 4 cm

## 2020-10-09 MED ORDER — PERFLUTREN LIPID MICROSPHERE
1.0000 mL | INTRAVENOUS | Status: AC | PRN
  Administered 2020-10-09: 2 mL via INTRAVENOUS

## 2020-10-28 ENCOUNTER — Telehealth: Payer: Self-pay | Admitting: Cardiovascular Disease

## 2020-10-28 MED ORDER — TICAGRELOR 90 MG PO TABS: 90.0000 mg | ORAL_TABLET | Freq: Two times a day (BID) | ORAL | 1 refills | Status: DC

## 2020-10-28 MED ORDER — ATORVASTATIN CALCIUM 80 MG PO TABS: 80.0000 mg | ORAL_TABLET | Freq: Every day | ORAL | 1 refills | Status: DC

## 2020-10-28 MED ORDER — ENTRESTO 49-51 MG PO TABS: 1.0000 | ORAL_TABLET | Freq: Two times a day (BID) | ORAL | 1 refills | Status: DC

## 2020-10-28 NOTE — Telephone Encounter (Signed)
*  STAT* If patient is at the pharmacy, call can be transferred to refill team.   1. Which medications need to be refilled? (please list name of each medication and dose if known)  BRILINTA 90 MG TABS tablet ENTRESTO 49-51 MG atorvastatin (LIPITOR) 80 MG tablet  2. Which pharmacy/location (including street and city if local pharmacy) is medication to be sent to? [id number] - [place],  - [address]  3. Do they need a 30 day or 90 day supply? 90 day   Patient states he needs 3 month supplies of his medications, because [place]. He states he needs them by 10/30/2020.

## 2020-10-28 NOTE — Telephone Encounter (Signed)
Sent in refills to the pharmacy.  

## 2020-11-01 ENCOUNTER — Other Ambulatory Visit: Payer: Self-pay | Admitting: Cardiovascular Disease

## 2020-11-21 ENCOUNTER — Other Ambulatory Visit: Payer: Self-pay | Admitting: Cardiovascular Disease

## 2021-01-12 ENCOUNTER — Other Ambulatory Visit: Payer: Self-pay

## 2021-01-12 ENCOUNTER — Encounter: Payer: Self-pay | Admitting: Cardiovascular Disease

## 2021-01-12 ENCOUNTER — Ambulatory Visit (INDEPENDENT_AMBULATORY_CARE_PROVIDER_SITE_OTHER): Payer: BC Managed Care – PPO | Admitting: Cardiovascular Disease

## 2021-01-12 DIAGNOSIS — I2102 ST elevation (STEMI) myocardial infarction involving left anterior descending coronary artery: Secondary | ICD-10-CM | POA: Diagnosis not present

## 2021-01-12 DIAGNOSIS — I5020 Unspecified systolic (congestive) heart failure: Secondary | ICD-10-CM | POA: Diagnosis not present

## 2021-01-12 DIAGNOSIS — U071 COVID-19: Secondary | ICD-10-CM

## 2021-01-12 DIAGNOSIS — E785 Hyperlipidemia, unspecified: Secondary | ICD-10-CM

## 2021-01-12 DIAGNOSIS — Z720 Tobacco use: Secondary | ICD-10-CM

## 2021-01-12 NOTE — Progress Notes (Signed)
 Cardiology Office Note    Date:  01/13/2021   ID:  Cory Berry, DOB 07/19/1960, MRN [id number]  PCP:  Patient, No Pcp Per (Inactive)  Cardiologist:  Spatafore Kelly, MD   F/u  office visit   History of Present Illness:  Cory Berry is a 59 y.o. male who presents for a 6 month follow-up cardiology evaluation   Cory Berry is  originally from the [place] and has been living in the [place] for approximately 10 [place].  On February 10, 2019 he developed new onset chest pain, and ECG showed ST elevation in V5 and V6 and a code STEMI was activated.  He underwent emergent cardiac catheterization and intervention by me and was found to have total occlusion of the proximal LAD.  There was 40% first marginal stenosis and he had a dominant RCA with luminal regularity.  He underwent successful emergent PCI to his LAD with ultimate insertion of a 3.0 x 32 mm Synergy DES stent postdilated to 3.3-3.25 taper with stenosis improved to 0% and resumption of TIMI-3 flow.  An in-hospital echo Doppler study on February 10, 2019 showed EF of 35 to 40%.   He was subsequently evaluated in the office on February 23, 2019 by Hao Meng.  At that time he was stable without recurrent chest pain.  He was on aspirin/Brilinta for DAPT.  Losartan 25 mg was added to his regimen of Toprol-XL 25 mg.  He has tolerated that well.  He has continued to be on atorvastatin 80 mg.  He was referred for follow-up echo Doppler study which was recently completed on June 21, 1999.  EF remains low at 30 to 35% and there was grade 1 diastolic dysfunction.  There were regional wall motion abnormalities.  I saw him for his initial evaluation with me in the office on July 05, 2019.  At that time he denied any recurrent chest pain PND orthopnea or palpitations.  Historically, he had started smoking as a teenager and after smoking for many years had quit smoking for over 23 years.  Unfortunately he had restarted smoking 10 years ago.   He had been smoking up to 2 packs/day and since his myocardial infarction he has significantly reduced his smoking to less than 1 pack/day for continues to smoke.  When his initial evaluation, with his reduced LV function I recommended transition to Entresto and provided him with samples of 24/26 mg twice a day 2 weeks and and recommended he discontinue losartan.  I recommended follow-up with our pharmacy clinic.  When I saw him in follow-up in May 2021 he was on Entresto 49/51 mg twice a day, Toprol-XL 25 mg in addition to aspirin and Brilinta and atorvastatin 80 mg.  He has felt well.  He denies chest pain or shortness of breath.  He wasreducing his cigarettes but unfortunately is still smoking.  He tells me he is contemplating a possible job promotion which may require him returning back to [place].  I saw him on December 21, 2019 and since his prior evaluation he turned down the job change to return to the [place].  He  continued to be asymptomatic without any heart failure symptoms and continues to be on Entresto 49/51 mg twice a day, metoprolol succinate 25 mg daily, spironolactone 12.5 mg.  He continued to be on DAPT with aspirin/ticagrelor 90 mg twice a day.  He felt well and denied any shortness of breath, PND or orthopnea.   Laboratory in May   showed a proBNP that was normal at 172.  Creatinine had improved to 1.17.  Potassium was 4.5.  His insurance at work to change and there has been some confusion with reference to his deductible.  He was told that it would now cost him $500 per month.  He has reduced tobacco but unfortunately is still smoking some.  During that evaluation, we were able to provide him with the information so that he would have a new card with his new insurance to significantly reduce his deductible for Entresto.  He continued to be on atorvastatin 80 mg for hyperlipidemia.  I last saw him on July 14, 2020 at which time he remained asymptomatic.  He denied  any  shortness of breath or chest pain or palpitations.  He continues to be on aspirin/Brilinta without bleeding.  He was on Entresto 49/51 mg twice a day, metoprolol succinate 25 mg and spironolactone 12.5 mg daily.  He continued to be on atorvastatin 80 mg.  He was planning to go to the [place] for family wedding in June.  I had recommended over the next several months that he had a repeat echo Doppler study for reassessment of LV size and function.  He underwent an echo Doppler study on Oct 09, 2020 which showed improvement in LV function now at 50 to 55% ejection fraction.  The left ventricular internal cavity remains mildly dilated.  He had normal diastolic parameters.  There was mild aortic sclerosis without stenosis.  There was mild dilation of his ascending aorta at 41 mm.  Cory Berry went to the [place] in June and ended up testing positive for COVID.  He had mild symptoms and had been vaccinated.  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  He denies any chest pain PND orthopnea.  He denies any significant shortness of breath.  He presents for reevaluation.  Past Medical History:  Diagnosis Date   Asthma     Past Surgical History:  Procedure Laterality Date   CORONARY STENT INTERVENTION N/A 02/10/2019   Procedure: CORONARY STENT INTERVENTION;  Surgeon: [place] Sine, MD;  Location: [facility];  Service: Cardiovascular;  Laterality: N/A;   CORONARY/GRAFT ACUTE MI REVASCULARIZATION N/A 02/10/2019   Procedure: Coronary/Graft Acute MI Revascularization;  Surgeon: Troy Sine, MD;  Location: [facility];  Service: Cardiovascular;  Laterality: N/A;   LEFT HEART CATH AND CORONARY ANGIOGRAPHY N/A 02/10/2019   Procedure: LEFT HEART CATH AND CORONARY ANGIOGRAPHY;  Surgeon: [place] Sine, MD;  Location: [facility];  Service: Cardiovascular;  Laterality: N/A;   NECK SURGERY      Current  Medications: Outpatient Medications Prior to Visit  Medication Sig Dispense Refill   albuterol (VENTOLIN HFA) 108 (90 Base) MCG/ACT inhaler Inhale 1-2 puffs into the lungs every 6 (six) hours as needed for wheezing or shortness of breath.     aspirin 81 MG chewable tablet Chew 1 tablet (81 mg total) by mouth daily. 30 tablet 12   atorvastatin (LIPITOR) 80 MG tablet Take 1 tablet (80 mg total) by mouth daily at 6 PM. 90 tablet 1   metoprolol succinate (TOPROL-XL) 25 MG 24 hr tablet TAKE 1 TABLET DAILY 90 tablet 1   nitroGLYCERIN (NITROSTAT) 0.4 MG SL tablet PLACE 1 TABLET UNDER THE TONGUE EVERY 5 MINUTES FOIR 3 DOSES AS NEEDED 25 tablet 11   sacubitril-valsartan (ENTRESTO) 49-51 MG Take  1 tablet by mouth 2 (two) times daily. 180 tablet 1   spironolactone (ALDACTONE) 25 MG tablet TAKE 1/2 TABLET BY MOUTH EVERY DAY 45 tablet 3   ticagrelor (BRILINTA) 90 MG TABS tablet Take 1 tablet (90 mg total) by mouth 2 (two) times daily. 180 tablet 1   No facility-administered medications prior to visit.     Allergies:   Patient has no known allergies.   Social History   Socioeconomic History   Marital status: Married    Spouse name: Not on file   Number of children: Not on file   Years of education: Not on file   Highest education level: Not on file  Occupational History   Not on file  Tobacco Use   Smoking status: Every Day   Smokeless tobacco: Never  Substance and Sexual Activity   Alcohol use: Yes   Drug use: Never   Sexual activity: Not on file  Other Topics Concern   Not on file  Social History Narrative   Not on file   Social Determinants of Health   Financial Resource Strain: Not on file  Food Insecurity: Not on file  Transportation Needs: Not on file  Physical Activity: Not on file  Stress: Not on file  Social Connections: Not on file    Socially he was born in the [place].  He moved to [place] approximately 10 years ago.  Over the last several years due to his job he  moved to the [place] area.  He currently works as a operation director in a factory and typically walks 3 miles per day at work.  Family History: Family history is negative for heart disease  ROS General: Negative; No fevers, chills, or night sweats;  HEENT: Negative; No changes in vision or hearing, sinus congestion, difficulty swallowing Pulmonary: Negative; No cough, wheezing, shortness of breath, hemoptysis Cardiovascular: Negative; No chest pain, presyncope, syncope, palpitations GI: Negative; No nausea, vomiting, diarrhea, or abdominal pain GU: Negative; No dysuria, hematuria, or difficulty voiding Musculoskeletal: Negative; no myalgias, joint pain, or weakness Hematologic/Oncology: Negative; no easy bruising, bleeding Endocrine: Negative; no heat/cold intolerance; no diabetes Neuro: Negative; no changes in balance, headaches Skin: Negative; No rashes or skin lesions Psychiatric: Negative; No behavioral problems, depression Sleep: Negative; No snoring, daytime sleepiness, hypersomnolence, bruxism, restless legs, hypnogognic hallucinations, no cataplexy Other comprehensive 14 point system review is negative.   PHYSICAL EXAM:   VS:  BP 138/70 (BP Location: Left Arm, Patient Position: Sitting, Cuff Size: Large)   Pulse 79   Ht 5' 7" (1.702 m)   Wt 200 lb (90.7 kg)   SpO2 98%   BMI 31.32 kg/m     Repeat blood pressure by me 132/72  Wt Readings from Last 3 Encounters:  01/12/21 200 lb (90.7 kg)  07/14/20 195 lb 6.4 oz (88.6 kg)  12/21/19 193 lb (87.5 kg)    General: Alert, oriented, no distress.  Euvolemic on appearance Skin: normal turgor, no rashes, warm and dry HEENT: Normocephalic, atraumatic. Pupils equal round and reactive to light; sclera anicteric; extraocular muscles intact;  Nose without nasal septal hypertrophy Mouth/Parynx benign; Mallinpatti scale 3 Neck: No JVD, no carotid bruits; normal carotid upstroke Lungs: clear to ausculatation and percussion; no  wheezing or rales Chest wall: without tenderness to palpitation Heart: PMI not displaced, RRR, s1 s2 normal, 1/6 systolic murmur, no diastolic murmur, no rubs, gallops, thrills, or heaves Abdomen: soft, nontender; no hepatosplenomehaly, BS+; abdominal aorta nontender and not dilated by palpation. Back: no   CVA tenderness Pulses 2+ Musculoskeletal: full range of motion, normal strength, no joint deformities Extremities: no clubbing cyanosis or edema, Homan's sign negative  Neurologic: grossly nonfocal; Cranial nerves grossly wnl Psychologic: Normal mood and affect    Studies/Labs Reviewed:    ECG (independently read by me):  NSR at 79, QS V1-2, no ectopy    July 14, 2020 ECG (independently read by me): NSR at 75, QS V1-3, no ectopy  December 21, 2019 ECG (independently read by me): Normal sinus rhythm at 75 bpm.  QS complex V1 through V4.  No ectopy.  Normal intervals.  May 2021  ECG (independently read by me): Normal sinus rhythm at 77 bpm.  Probable biatrial enlargement.  QS complex V1 through V4 consistent with his anterior wall myocardial infarction.  Normal intervals.  Recent Labs: BMP Latest Ref Rng & Units 07/10/2020 10/05/2019 08/01/2019  Glucose 65 - 99 mg/dL 89 82 76  BUN 6 - 24 mg/dL 20 25(H) 28(H)  Creatinine 0.76 - 1.27 mg/dL 1.08 1.17 1.32(H)  BUN/Creat Ratio 9 - 20 19 21(H) 21(H)  Sodium 134 - 144 mmol/L 138 141 141  Potassium 3.5 - 5.2 mmol/L 4.9 4.5 4.5  Chloride 96 - 106 mmol/L 101 99 104  CO2 20 - 29 mmol/L 21 23 21  Calcium 8.7 - 10.2 mg/dL 9.5 9.8 9.4     Hepatic Function Latest Ref Rng & Units 07/10/2020 07/18/2019 03/09/2019  Total Protein 6.0 - 8.5 g/dL 7.0 6.8 6.7  Albumin 3.8 - 4.9 g/dL 4.4 3.9 4.4  AST 0 - 40 IU/L 29 20 21  ALT 0 - 44 IU/L 28 20 25  Alk Phosphatase 44 - 121 IU/L 70 84 86  Total Bilirubin 0.0 - 1.2 mg/dL 0.4 0.2 0.4    CBC Latest Ref Rng & Units 07/10/2020 02/11/2019 02/10/2019  WBC 3.4 - 10.8 x10E3/uL 6.2 9.7 -  Hemoglobin 13.0 - 17.7  g/dL 13.3 13.4 13.3  Hematocrit 37.5 - 51.0 % 39.6 40.6 39.0  Platelets 150 - 450 x10E3/uL 247 194 -   Lab Results  Component Value Date   MCV 96 07/10/2020   MCV 97.1 02/11/2019   MCV 96.8 02/10/2019   Lab Results  Component Value Date   TSH 1.810 07/10/2020   No results found for: HGBA1C   BNP No results found for: BNP  ProBNP    Component Value Date/Time   PROBNP 166 07/10/2020 0807     Lipid Panel     Component Value Date/Time   CHOL 100 07/10/2020 0807   TRIG 57 07/10/2020 0807   HDL 50 07/10/2020 0807   CHOLHDL 2.0 07/10/2020 0807   CHOLHDL 3.8 02/10/2019 0358   VLDL 63 (H) 02/10/2019 0358   LDLCALC 37 07/10/2020 0807   LABVLDL 13 07/10/2020 0807     RADIOLOGY: No results found.   Additional studies/ records that were reviewed today include:  I reviewed the patient's hospitalization records from September 2020.  I have reviewed catheterization findings, echo Doppler study, subsequent evaluation with Hao Meng, PA, and most recent echo Doppler evaluation from June 21, 2019.  ECHO: 10/09/2020 IMPRESSIONS   1. Left ventricular ejection fraction, by estimation, is 50 to 55%. The  left ventricle has low normal function. The left ventricle has no regional  wall motion abnormalities. The left ventricular internal cavity size was  mildly dilated. Left ventricular  diastolic parameters were normal.   2. Right ventricular systolic function is normal. The right ventricular  size is normal. There is   normal pulmonary artery systolic pressure.   3. The mitral valve is normal in structure. Trivial mitral valve  regurgitation.   4. The aortic valve is tricuspid. Aortic valve regurgitation is trivial.  Mild aortic valve sclerosis is present, with no evidence of aortic valve  stenosis.   5. Aortic dilatation noted. There is mild dilatation of the ascending  aorta, measuring 41 mm.   6. The inferior vena cava is normal in size with greater than 50%  respiratory  variability, suggesting right atrial pressure of 3 mmHg.   Comparison(s): Compared to prior TTE in 05/2019, the LVEF has  significantly improved to 50-55% with improved wall motion. Ascending  aorta now measures 41mm (previously 38mm).   ASSESSMENT:    1. Acute ST elevation myocardial infarction (STEMI) due to occlusion of proximal portion of left anterior descending (LAD) coronary artery (HCC): February 10, 2019   2. HFrEF (heart failure with reduced ejection fraction) (HCC)   3. Hyperlipidemia with target LDL less than 70   4. Tobacco abuse   5. COVID-19: June 2022     PLAN:  Cory Berry is a 59-year-old gentleman originally from the [place] who denied any prior cardiac history but had a long history of tobacco use.  He presented with an acute coronary syndrome on February 10, 2019 and was found to have total proximal occlusion of a large LAD which was successfully stented.  In January 2021 and despite being on losartan 25 mg and Toprol-XL 25 mg an echo Doppler study continued to show reduced LV function with EF at 30 to 35% with grade 1 diastolic dysfunction.  There was mild dilation of his left ventricular.  He has successfully been transitioned to Entresto and remains euvolemic on 49/51 mg twice daily in addition to metoprolol succinate 25 mg and spironolactone 12.5 mg daily A proBNP on July 10, 2020 showed a normal level at 166.  I reviewed with him his most recent echo Doppler study from 01/09/2021.  LV function has now improved to 50 to 55% ejection fraction.  The LV remained mildly dilated.  There was mild aortic sclerosis without stenosis.  His ascending aorta was mildly dilated at 41 mm.  Clinically he is feeling well.  He did test positive for COVID while in the [place] in June.  Presently, his resting pulse is in the upper 70s to 80s.  I have suggested slight additional titration of metoprolol succinate from 25 mg daily and over the next 2 weeks he will increase  this to 37.5 mg daily and after 2 weeks increase to 50 mg daily as heart rate allows.  We again discussed the importance of complete abstinence of smoking which she still does occasionally.  He continues to be on DAPT with aspirin/ticagrelor and is tolerating this well.  He is on atorvastatin 80 mg for hyperlipidemia with target LDL less than 70.  LDL cholesterol February 2022 was excellent at 37.  I will see him in 6 months for reevaluation.    Medication Adjustments/Labs and Tests Ordered: Current medicines are reviewed at length with the patient today.  Concerns regarding medicines are outlined above.  Medication changes, Labs and Tests ordered today are listed in the Patient Instructions below. Patient Instructions  Medication Instructions:  Increase Metoprolol to 37.5 mg for 2 weeks, then increase to 50 mg tablet.   *If you need a refill on your cardiac medications before your next appointment, please call your pharmacy*   Follow-Up: At [facility],   you and your health needs are our priority.  As part of our continuing mission to provide you with exceptional heart care, we have created designated Provider Care Teams.  These Care Teams include your primary Cardiologist (physician) and Advanced Practice Providers (APPs -  Physician Assistants and Nurse Practitioners) who all work together to provide you with the care you need, when you need it.  We recommend signing up for the patient portal called "MyChart".  Sign up information is provided on this After Visit Summary.  MyChart is used to connect with patients for Virtual Visits (Telemedicine).  Patients are able to view lab/test results, encounter notes, upcoming appointments, etc.  Non-urgent messages can be sent to your provider as well.   To learn more about what you can do with MyChart, go to [email or web address].    Your next appointment:   6 month(s)  The format for your next appointment:   In Person  Provider:   Stoudt  Kelly, MD      Signed, Dondero Kelly, MD  01/13/2021 11:45 AM    [facility] [address], [address], [place], [place]  [address] Phone: [phone]   

## 2021-01-12 NOTE — Patient Instructions (Signed)
Medication Instructions:  Increase Metoprolol to 37.5 mg for 2 weeks, then increase to 50 mg tablet.   *If you need a refill on your cardiac medications before your next appointment, please call your pharmacy*   Follow-Up: At [facility], you and your health needs are our priority.  As part of our continuing mission to provide you with exceptional heart care, we have created designated Provider Care Teams.  These Care Teams include your primary Cardiologist (physician) and Advanced Practice Providers (APPs -  Physician Assistants and Nurse Practitioners) who all work together to provide you with the care you need, when you need it.  We recommend signing up for the patient portal called "MyChart".  Sign up information is provided on this After Visit Summary.  MyChart is used to connect with patients for Virtual Visits (Telemedicine).  Patients are able to view lab/test results, encounter notes, upcoming appointments, etc.  Non-urgent messages can be sent to your provider as well.   To learn more about what you can do with MyChart, go to [email or web address].    Your next appointment:   6 month(s)  The format for your next appointment:   In Person  Provider:   Nicki Guadalajara, MD

## 2021-01-13 ENCOUNTER — Encounter: Payer: Self-pay | Admitting: Cardiovascular Disease

## 2021-03-21 DIAGNOSIS — H60501 Unspecified acute noninfective otitis externa, right ear: Secondary | ICD-10-CM | POA: Diagnosis not present

## 2021-04-14 ENCOUNTER — Other Ambulatory Visit: Payer: Self-pay | Admitting: Cardiovascular Disease

## 2021-04-14 ENCOUNTER — Telehealth: Payer: Self-pay | Admitting: Cardiovascular Disease

## 2021-04-14 NOTE — Telephone Encounter (Signed)
*  STAT* If patient is at the pharmacy, call can be transferred to refill team.   1. Which medications need to be refilled? (please list name of each medication and dose if known)   metoprolol succinate (TOPROL-XL) 50 MG 24 hr tablet  2. Which pharmacy/location (including street and city if local pharmacy) is medication to be sent to? [id number] - [place], [place] - [address]  3. Do they need a 30 day or 90 day supply?  90 day supply  Patient states he is completely out of medication.

## 2021-04-14 NOTE — Telephone Encounter (Signed)
*  STAT* If patient is at the pharmacy, call can be transferred to refill team.   1. Which medications need to be refilled? (please list name of each medication and dose if known) new prescription for Metoprolol 50 mg  2. Which pharmacy/location (including street and city if local pharmacy) is medication to be sent to? [facility] [place],  3. Do they need a 30 day or 90 day supply? 90 days and refills- Patient is out of medicine- need today

## 2021-04-15 MED ORDER — METOPROLOL SUCCINATE ER 50 MG PO TB24: 50.0000 mg | ORAL_TABLET | Freq: Every day | ORAL | 3 refills | Status: DC

## 2021-04-15 NOTE — Telephone Encounter (Signed)
Refill sent to the pharmacy electronically. Patient made ware.

## 2021-04-15 NOTE — Telephone Encounter (Signed)
Patient states the prescription was sent as the 25 mg tablets, but his prescription was increased to 50 mg. He says because of this he has been out of his medication for 3 days and needs the correct dose sent in today due to the holiday.

## 2021-04-28 ENCOUNTER — Other Ambulatory Visit: Payer: Self-pay | Admitting: Cardiovascular Disease

## 2021-05-12 ENCOUNTER — Other Ambulatory Visit: Payer: Self-pay | Admitting: Cardiovascular Disease

## 2021-06-23 IMAGING — DX DG CHEST 1V PORT
1 series · 1 of 1 positions shown · non-contrast
Comparison: None.

CLINICAL DATA: C/o chest pressure to center of chest that radiates
up into neck that woke him up this morning. Denies SOB. Reports
nausea and vomiting this AM. States this has happened every other
night x 3. States today is first day with nausea and vomiting. EKG
on arrival shows STEMI. Pt straight to room after EKG

EXAM:
PORTABLE CHEST 1 VIEW

[chest ap]
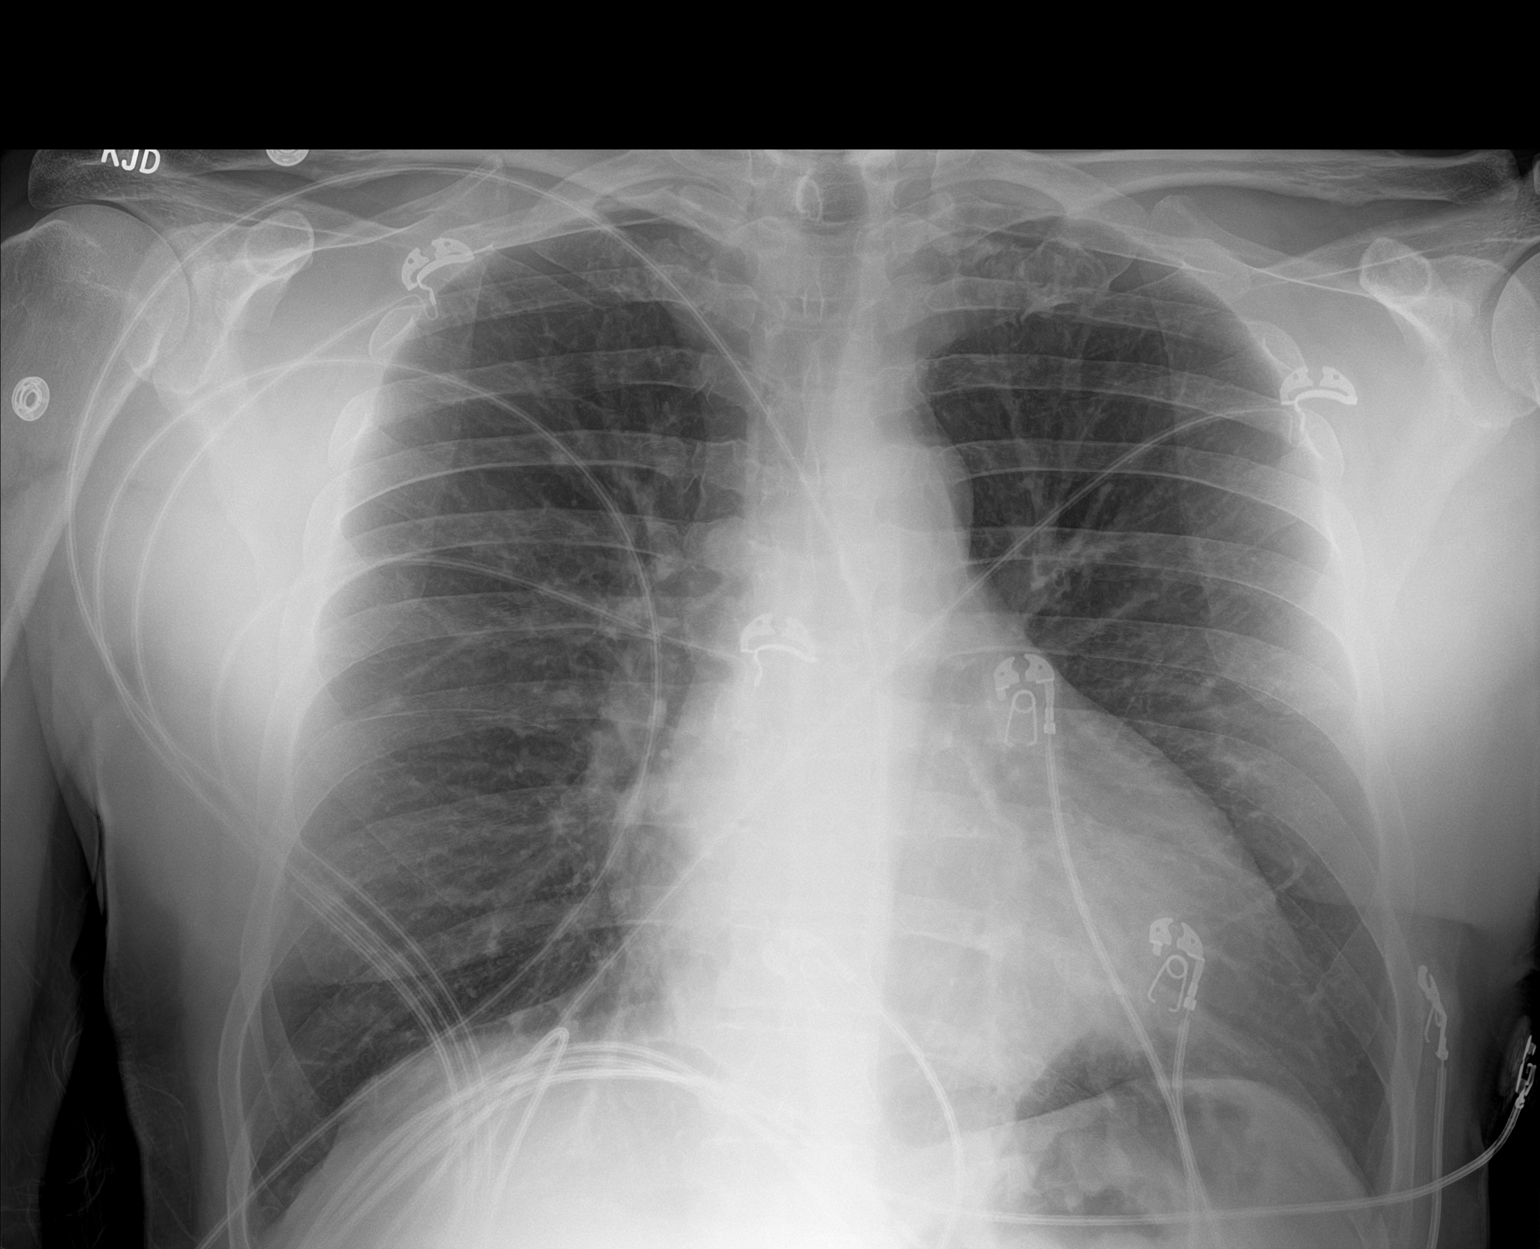

[1 of 1 positions shown; findings below may reference images not displayed]

FINDINGS: Cardiac silhouette is normal in size. Normal mediastinal and hilar
contours.

Clear lungs.  No pleural effusion or pneumothorax.

Skeletal structures are grossly intact.
IMPRESSION: No active disease.

## 2021-08-09 ENCOUNTER — Other Ambulatory Visit: Payer: Self-pay | Admitting: Cardiovascular Disease

## 2021-08-29 ENCOUNTER — Other Ambulatory Visit: Payer: Self-pay | Admitting: Cardiovascular Disease

## 2021-10-12 ENCOUNTER — Encounter: Payer: Self-pay | Admitting: Cardiovascular Disease

## 2021-10-12 ENCOUNTER — Ambulatory Visit (INDEPENDENT_AMBULATORY_CARE_PROVIDER_SITE_OTHER): Payer: BC Managed Care – PPO | Admitting: Cardiovascular Disease

## 2021-10-12 DIAGNOSIS — I5020 Unspecified systolic (congestive) heart failure: Secondary | ICD-10-CM | POA: Diagnosis not present

## 2021-10-12 DIAGNOSIS — I2102 ST elevation (STEMI) myocardial infarction involving left anterior descending coronary artery: Secondary | ICD-10-CM | POA: Diagnosis not present

## 2021-10-12 DIAGNOSIS — E785 Hyperlipidemia, unspecified: Secondary | ICD-10-CM

## 2021-10-12 DIAGNOSIS — Z79899 Other long term (current) drug therapy: Secondary | ICD-10-CM | POA: Diagnosis not present

## 2021-10-12 DIAGNOSIS — Z720 Tobacco use: Secondary | ICD-10-CM

## 2021-10-12 DIAGNOSIS — U071 COVID-19: Secondary | ICD-10-CM

## 2021-10-12 MED ORDER — ENTRESTO 49-51 MG PO TABS: 1.0000 | ORAL_TABLET | Freq: Two times a day (BID) | ORAL | 3 refills | Status: DC

## 2021-10-12 NOTE — Progress Notes (Signed)
Cardiology Office Note    Date:  10/18/2021   ID:  Cory Berry, DOB 1960/09/23, MRN [id number]  PCP:  Pcp, No  Cardiologist:  Shelva Majestic, MD   9 month F/u  office visit   History of Present Illness:  Cory Berry is a 61 y.o. male who presents for a 61monthfollow-up cardiology evaluation   [place].  On February 10, 2019 he developed new onset chest pain, and ECG showed ST elevation in V5 and V6 and a code STEMI was activated.  He underwent emergent cardiac catheterization and intervention by me and was found to have total occlusion of the proximal LAD.  There was 40% first marginal stenosis and he had a dominant RCA with luminal regularity.  He underwent successful emergent PCI to his LAD with ultimate insertion of a 3.0 x 32 mm Synergy DES stent postdilated to 3.3-3.25 taper with stenosis improved to 0% and resumption of TIMI-3 flow.  An in-hospital echo Doppler study on February 10, 2019 showed EF of 35 to 40%.   He was subsequently evaluated in the office on February 23, 2019 by HAlmyra Deforest PUtah  At that time he was stable without recurrent chest pain.  He was on aspirin/Brilinta for DAPT.  Losartan 25 mg was added to his regimen of Toprol-XL 25 mg.  He has tolerated that well.  He has continued to be on atorvastatin 80 mg.  He was referred for follow-up echo Doppler study which was recently completed on June 21, 1999.  EF remains low at 30 to 35% and there was grade 1 diastolic dysfunction.  There were regional wall motion abnormalities.  I saw him for his initial evaluation with me in the office on July 05, 2019.  At that time he denied any recurrent chest pain PND orthopnea or palpitations.  Historically, he had started smoking as a teenager and after smoking for many years had quit smoking for over 23 years.  Unfortunately he had restarted smoking 10 years ago.  He had been  smoking up to 2 packs/day and since his myocardial infarction he has significantly reduced his smoking to less than 1 pack/day for continues to smoke.  When his initial evaluation, with his reduced LV function [facility] 24/26 mg twice a day 2 weeks and and recommended he discontinue losartan.  I recommended follow-up with our pharmacy clinic.  When I saw him in follow-up in May 2021 he was on Entresto 49/51 mg twice a day, Toprol-XL 25 mg in addition to aspirin and Brilinta and atorvastatin 80 mg.  He has felt well.  He denies chest pain or shortness of breath.  He wasreducing his cigarettes but unfortunately is still smoking.  He tells me he is contemplating a possible job promotion which [place]  I saw him on December 21, 2019 and since his prior evaluation he turned down the job change [place]  He  continued to be asymptomatic without any heart failure symptoms and continues to be on Entresto 49/51 mg twice a day, metoprolol succinate 25 mg daily, spironolactone 12.5 mg.  He continued to be on DAPT with aspirin/ticagrelor 90 mg twice a day.  He felt well and denied any shortness of breath, PND or orthopnea.   Laboratory in May showed  a proBNP that was normal at 172.  Creatinine had improved to 1.17.  Potassium was 4.5.  His insurance at work to change and there has been some confusion with reference to his deductible.  He was told that it would now cost him $500 per month.  He has reduced tobacco but unfortunately is still smoking some.  During that evaluation, we were able to provide him with the information so that he would have a new card with his new insurance to significantly reduce his deductible for Entresto.  He continued to be on atorvastatin 80 mg for hyperlipidemia.  I saw him on July 14, 2020 at which time he remained asymptomatic.  He denied  any shortness of breath or  chest pain or palpitations.  He continues to be on aspirin/Brilinta without bleeding.  He was on Entresto 49/51 mg twice a day, metoprolol succinate 25 mg and spironolactone 12.5 mg daily.  He continued to be on atorvastatin 80 mg.  He was planning to go to the [place] for family wedding in June.  I had recommended over the next several months that he had a repeat echo Doppler study for reassessment of LV size and function.  He underwent an echo Doppler study on Oct 09, 2020 which showed improvement in LV function now at 50 to 55% ejection fraction.  The left ventricular internal cavity remains mildly dilated.  He had normal diastolic parameters.  There was mild aortic sclerosis without stenosis.  There was mild dilation of his ascending aorta at 41 mm.  I last saw him in January 12, 2021 at which time he continued to be stable.  Cory Berry went to the [place] in June and ended up testing positive for COVID.  He had mild symptoms and had been vaccinated.  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  He denies any chest pain PND orthopnea.  He denies any significant shortness of breath.  During that evaluation, his resting pulse was in the upper 70s to 80s and I suggested slight additional titration of metoprolol succinate from 25 mg daily up to 37.5 mg daily and after 2 weeks depending upon heart rate increased to 50 mg.  I again discussed the importance of complete smoking cessation.  LDL cholesterol in February 2022 was excellent at 37 on atorvastatin 80 mg.  Since I last saw him, he continues to remain stable.  He denies any chest pain or shortness of breath.  He continues to be on aspirin and Brilinta for DAPT, Entresto 49/51 mg twice a day, spironolactone 12.5 mg and metoprolol succinate at 50 mg.  He continues to be active.  He has significantly reduced his tobacco use but still smokes occasional cigarettes.  He denies  presyncope or syncope, PND orthopnea.  He presents for evaluation.  Past Medical History:  Diagnosis Date   Asthma     Past Surgical History:  Procedure Laterality Date   CORONARY STENT INTERVENTION N/A 02/10/2019   Procedure: CORONARY STENT INTERVENTION;  Surgeon: Troy Sine, MD;  Location: [facility];  Service: Cardiovascular;  Laterality: N/A;   CORONARY/GRAFT ACUTE MI REVASCULARIZATION N/A 02/10/2019   Procedure: Coronary/Graft Acute MI Revascularization;  Surgeon: Troy Sine, MD;  Location: [facility];  Service: Cardiovascular;  Laterality: N/A;   LEFT HEART CATH AND CORONARY ANGIOGRAPHY N/A 02/10/2019   Procedure: LEFT HEART CATH AND CORONARY ANGIOGRAPHY;  Surgeon:  Troy Sine, MD;  Location: [facility];  Service: Cardiovascular;  Laterality: N/A;   NECK SURGERY      Current Medications: Outpatient Medications Prior to Visit  Medication Sig Dispense Refill   albuterol (VENTOLIN HFA) 108 (90 Base) MCG/ACT inhaler Inhale 1-2 puffs into the lungs every 6 (six) hours as needed for wheezing or shortness of breath.     aspirin 81 MG chewable tablet Chew 1 tablet (81 mg total) by mouth daily. 30 tablet 12   atorvastatin (LIPITOR) 80 MG tablet TAKE 1 TABLET BY MOUTH DAILY AT 6 PM. 90 tablet 1   BRILINTA 90 MG TABS tablet TAKE 1 TABLET BY MOUTH 2 TIMES DAILY. 180 tablet 1   metoprolol succinate (TOPROL-XL) 50 MG 24 hr tablet Take 1 tablet (50 mg total) by mouth daily. 90 tablet 3   nitroGLYCERIN (NITROSTAT) 0.4 MG SL tablet PLACE 1 TABLET UNDER THE TONGUE EVERY 5 MINUTES FOIR 3 DOSES AS NEEDED 25 tablet 11   spironolactone (ALDACTONE) 25 MG tablet TAKE 1/2 TABLET BY MOUTH EVERY DAY 45 tablet 3   ENTRESTO 49-51 MG TAKE 1 TABLET BY MOUTH TWICE A DAY 60 tablet 5   No facility-administered medications prior to visit.     Allergies:   Patient has no known allergies.   Social History   Socioeconomic History   Marital status: Married    Spouse name: Not  on file   Number of children: Not on file   Years of education: Not on file   Highest education level: Not on file  Occupational History   Not on file  Tobacco Use   Smoking status: Every Day   Smokeless tobacco: Never  Substance and Sexual Activity   Alcohol use: Yes   Drug use: Never   Sexual activity: Not on file  Other Topics Concern   Not on file  Social History Narrative   Not on file   Social Determinants of Health   Financial Resource Strain: Not on file  Food Insecurity: Not on file  Transportation Needs: Not on file  Physical Activity: Not on file  Stress: Not on file  Social Connections: Not on file    Socially he was born in the [place].  He moved to [place] approximately 10 years ago.  Over the last several years due to his job he moved to the [place] area.  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  Family History: Family history is negative for heart disease  ROS General: Negative; No fevers, chills, or night sweats;  HEENT: Negative; No changes in vision or hearing, sinus congestion, difficulty swallowing Pulmonary: Negative; No cough, wheezing, shortness of breath, hemoptysis Cardiovascular: Negative; No chest pain, presyncope, syncope, palpitations GI: Negative; No nausea, vomiting, diarrhea, or abdominal pain GU: Negative; No dysuria, hematuria, or difficulty voiding Musculoskeletal: Negative; no myalgias, joint pain, or weakness Hematologic/Oncology: Negative; no easy bruising, bleeding Endocrine: Negative; no heat/cold intolerance; no diabetes Neuro: Negative; no changes in balance, headaches Skin: Negative; No rashes or skin lesions Psychiatric: Negative; No behavioral problems, depression Sleep: Negative; No snoring, daytime sleepiness, hypersomnolence, bruxism, restless legs, hypnogognic hallucinations, no cataplexy Other comprehensive 14 point system review is  negative.   PHYSICAL EXAM:   VS:  BP 138/81   Pulse 79   Ht _0  (1.702 m)   Wt 209 lb 12.8 oz (95.2 kg)   SpO2 98%   BMI 32.86 kg/m  Repeat blood pressure by me was 128/78  Wt Readings from Last 3 Encounters:  10/12/21 209 lb 12.8 oz (95.2 kg)  01/12/21 200 lb (90.7 kg)  07/14/20 195 lb 6.4 oz (88.6 kg)    General: Alert, oriented, no distress.  Euvolemic Skin: normal turgor, no rashes, warm and dry HEENT: Normocephalic, atraumatic. Pupils equal round and reactive to light; sclera anicteric; extraocular muscles intact;  Nose without nasal septal hypertrophy Mouth/Parynx benign; Mallinpatti scale 3 Neck: No JVD, no carotid bruits; normal carotid upstroke Lungs: clear to ausculatation and percussion; no wheezing or rales Chest wall: without tenderness to palpitation Heart: PMI not displaced, RRR, s1 s2 normal, 1/6 systolic murmur, no diastolic murmur, no rubs, gallops, thrills, or heaves Abdomen: soft, nontender; no hepatosplenomehaly, BS+; abdominal aorta nontender and not dilated by palpation. Back: no CVA tenderness Pulses 2+ Musculoskeletal: full range of motion, normal strength, no joint deformities Extremities: no clubbing cyanosis or edema, Homan's sign negative  Neurologic: grossly nonfocal; Cranial nerves grossly wnl Psychologic: Normal mood and affect    Studies/Labs Reviewed:    Oct 12, 2021 ECG (independently read by me): NSR at 79, QS V1-V3  January 12, 2022 ECG (independently read by me):  NSR at 79, QS V1-2, no ectopy    July 14, 2020 ECG (independently read by me): NSR at 75, QS V1-3, no ectopy  December 21, 2019 ECG (independently read by me): Normal sinus rhythm at 75 bpm.  QS complex V1 through V4.  No ectopy.  Normal intervals.  May 2021  ECG (independently read by me): Normal sinus rhythm at 77 bpm.  Probable biatrial enlargement.  QS complex V1 through V4 consistent with his anterior wall myocardial infarction.  Normal intervals.  Recent  Labs:    Latest Ref Rng & Units 10/16/2021    8:09 AM 07/10/2020    8:07 AM 10/05/2019    3:23 PM  BMP  Glucose 70 - 99 mg/dL 92   89   82    BUN 6 - 24 mg/dL _0 Creatinine 0.76 - 1.27 mg/dL 1.16   1.08   1.17    BUN/Creat Ratio 9 - _1 Sodium 134 - 144 mmol/L 139   138   141    Potassium 3.5 - 5.2 mmol/L 4.5   4.9   4.5    Chloride 96 - 106 mmol/L 104   101   99    CO2 20 - 29 mmol/L _2 Calcium 8.7 - 10.2 mg/dL 9.4   9.5   9.8          Latest Ref Rng & Units 10/16/2021    8:09 AM 07/10/2020    8:07 AM 07/18/2019    9:27 AM  Hepatic Function  Total Protein 6.0 - 8.5 g/dL 7.2   7.0   6.8    Albumin 3.8 - 4.9 g/dL 4.8   4.4   3.9    AST 0 - 40 IU/L _3 ALT 0 - 44 IU/L _4 Alk Phosphatase 44 - 121 IU/L 77   70   84    Total Bilirubin 0.0 - 1.2 mg/dL 0.4   0.4   0.2         Latest Ref Rng & Units 10/16/2021  8:09 AM 07/10/2020    8:07 AM 02/11/2019    2:37 AM  CBC  WBC 3.4 - 10.8 x10E3/uL 7.8   6.2   9.7    Hemoglobin 13.0 - 17.7 g/dL 13.5   13.3   13.4    Hematocrit 37.5 - 51.0 % 39.7   39.6   40.6    Platelets 150 - 450 x10E3/uL 190   247   194     Lab Results  Component Value Date   MCV 96 10/16/2021   MCV 96 07/10/2020   MCV 97.1 02/11/2019   Lab Results  Component Value Date   TSH 1.750 10/16/2021   No results found for: HGBA1C   BNP No results found for: BNP  ProBNP    Component Value Date/Time   PROBNP 117 10/16/2021 0809     Lipid Panel     Component Value Date/Time   CHOL 118 10/16/2021 0809   TRIG 62 10/16/2021 0809   HDL 65 10/16/2021 0809   CHOLHDL 1.8 10/16/2021 0809   CHOLHDL 3.8 02/10/2019 0358   VLDL 63 (H) 02/10/2019 0358   LDLCALC 40 10/16/2021 0809   LABVLDL 13 10/16/2021 0809     RADIOLOGY: No results found.   Additional studies/ records that were reviewed today include:  I reviewed the patient's hospitalization records from September 2020.  I have reviewed  catheterization findings, echo Doppler study, subsequent evaluation with Almyra Deforest, PA, and most recent echo Doppler evaluation from June 21, 2019.  ECHO: 10/09/2020 IMPRESSIONS   1. Left ventricular ejection fraction, by estimation, is 50 to 55%. The  left ventricle has low normal function. The left ventricle has no regional  wall motion abnormalities. The left ventricular internal cavity size was  mildly dilated. Left ventricular  diastolic parameters were normal.   2. Right ventricular systolic function is normal. The right ventricular  size is normal. There is normal pulmonary artery systolic pressure.   3. The mitral valve is normal in structure. Trivial mitral valve  regurgitation.   4. The aortic valve is tricuspid. Aortic valve regurgitation is trivial.  Mild aortic valve sclerosis is present, with no evidence of aortic valve  stenosis.   5. Aortic dilatation noted. There is mild dilatation of the ascending  aorta, measuring 41 mm.   6. The inferior vena cava is normal in size with greater than 50%  respiratory variability, suggesting right atrial pressure of 3 mmHg.   Comparison(s): Compared to prior TTE in 05/2019, the LVEF has  significantly improved to 50-55% with improved wall motion. Ascending  aorta now measures 34m (previously 342m.   ASSESSMENT:    1. Acute ST elevation myocardial infarction (STEMI) due to occlusion of proximal portion of left anterior descending (LAD) coronary artery ([facility] February 10, 2019   2. HFrEF (heart failure with reduced ejection fraction) ([facility]   3. Hyperlipidemia with target LDL less than 70   4. Medication management   5. COVID-19: June 2022   6. Tobacco abuse     PLAN:  Cory Berry a [age]ear old gentleman [place] denied any prior cardiac history but had a long history of tobacco use.  He presented with an acute coronary syndrome on February 10, 2019 and was found to have total  proximal occlusion of a large LAD which was successfully stented.  In January 2021 and despite being on losartan 25 mg and Toprol-XL 25 mg an echo Doppler study continued to show reduced LV  function with EF at 30 to 35% with grade 1 diastolic dysfunction.  There was mild dilation of his left ventricular.  He has successfully been transitioned to [facility] and remains euvolemic on 49/51 mg twice daily in addition to metoprolol succinate 25 mg and spironolactone 12.5 mg daily A proBNP on July 10, 2020 showed a normal level at 166.  His most recent echo Doppler study from 8/19/202 demonstrated improvement in LV function to an EF of 50 to 55%.    The LV remained mildly dilated.  There was mild aortic sclerosis without stenosis.  His ascending aorta was mildly dilated at 41 mm.  At his last evaluation in August 2022 I recommended further titration of metoprolol succinate.  He is now on 50 mg daily with resting heart rate in the 70s.  He continues to be on Entresto 49/51 mg daily, and spironolactone 12.5 mg.  He is euvolemic on exam.  He has continued to be on DAPT with aspirin/Brilinta and denies any bleeding.  I have recommended follow-up fasting laboratory with a comprehensive metabolic panel, CBC, TSH, proBNP, and fasting lipid studies.  He continues to be on atorvastatin 20 mg daily.  In 6 months I have recommended a follow-up echo Doppler evaluation with subsequent office visit.  Medication Adjustments/Labs and Tests Ordered: Current medicines are reviewed at length with the patient today.  Concerns regarding medicines are outlined above.  Medication changes, Labs and Tests ordered today are listed in the Patient Instructions below. Patient Instructions  Medication Instructions:  Your Physician recommend you continue on your current medication as directed.    *If you need a refill on your cardiac medications before your next appointment, please call your pharmacy*   Lab Work: Your physician recommends  that you return for lab work in 1 week (CMP, ProBNP, CBC, TSH, Lipid)  If you have labs (blood work) drawn today and your tests are completely normal, you will receive your results only by: MyChart Message (if you have [place]) OR A paper copy in the mail If you have any lab test that is abnormal or we need to change your treatment, we will call you to review the results.   Testing/Procedures: Your physician has requested that you have an echocardiogram November, 2023. Echocardiography is a painless test that uses sound waves to create images of your heart. It provides your doctor with information about the size and shape of your heart and how well your heart's chambers and valves are working. This procedure takes approximately one hour. There are no restrictions for this procedure. [address]    Follow-Up: At [facility], you and your health needs are our priority.  As part of our continuing mission to provide you with exceptional heart care, we have created designated Provider Care Teams.  These Care Teams include your primary Cardiologist (physician) and Advanced Practice Providers (APPs -  Physician Assistants and Nurse Practitioners) who all work together to provide you with the care you need, when you need it.  We recommend signing up for the patient portal called "MyChart".  Sign up information is provided on this After Visit Summary.  MyChart is used to connect with patients for Virtual Visits (Telemedicine).  Patients are able to view lab/test results, encounter notes, upcoming appointments, etc.  Non-urgent messages can be sent to your provider as well.   To learn more about what you can do with MyChart, go to [email or web address].    Your next appointment:   6  month(s)  The format for your next appointment:   In Person  Provider:   Shelva Majestic, MD {   Important Information About Sugar         Signed, Shelva Majestic, MD  10/18/2021 2:24 PM     [facility] [address], [place], [place], [place]  [address] Phone: [phone]

## 2021-10-12 NOTE — Patient Instructions (Signed)
Medication Instructions:  Your Physician recommend you continue on your current medication as directed.    *If you need a refill on your cardiac medications before your next appointment, please call your pharmacy*   Lab Work: Your physician recommends that you return for lab work in 1 week (CMP, ProBNP, CBC, TSH, Lipid)  If you have labs (blood work) drawn today and your tests are completely normal, you will receive your results only by: MyChart Message (if you have MyChart) OR A paper copy in the mail If you have any lab test that is abnormal or we need to change your treatment, we will call you to review the results.   Testing/Procedures: Your physician has requested that you have an echocardiogram November, 2023. Echocardiography is a painless test that uses sound waves to create images of your heart. It provides your doctor with information about the size and shape of your heart and how well your heart's chambers and valves are working. This procedure takes approximately one hour. There are no restrictions for this procedure. [address]    Follow-Up: At [facility], you and your health needs are our priority.  As part of our continuing mission to provide you with exceptional heart care, we have created designated Provider Care Teams.  These Care Teams include your primary Cardiologist (physician) and Advanced Practice Providers (APPs -  Physician Assistants and Nurse Practitioners) who all work together to provide you with the care you need, when you need it.  We recommend signing up for the patient portal called "MyChart".  Sign up information is provided on this After Visit Summary.  MyChart is used to connect with patients for Virtual Visits (Telemedicine).  Patients are able to view lab/test results, encounter notes, upcoming appointments, etc.  Non-urgent messages can be sent to your provider as well.   To learn more about what you can do with MyChart, go to  [email or web address].    Your next appointment:   6 month(s)  The format for your next appointment:   In Person  Provider:   Nicki Gu[place], MD {   Important Information About Sugar

## 2021-10-16 DIAGNOSIS — Z79899 Other long term (current) drug therapy: Secondary | ICD-10-CM | POA: Diagnosis not present

## 2021-10-16 DIAGNOSIS — I5020 Unspecified systolic (congestive) heart failure: Secondary | ICD-10-CM | POA: Diagnosis not present

## 2021-10-17 LAB — COMPREHENSIVE METABOLIC PANEL
ALT: 30 IU/L (ref 0–44)
AST: 25 IU/L (ref 0–40)
Albumin/Globulin Ratio: 2 (ref 1.2–2.2)
Albumin: 4.8 g/dL (ref 3.8–4.9)
Alkaline Phosphatase: 77 IU/L (ref 44–121)
BUN/Creatinine Ratio: 16 (ref 9–20)
BUN: 18 mg/dL (ref 6–24)
Bilirubin Total: 0.4 mg/dL (ref 0.0–1.2)
CO2: 22 mmol/L (ref 20–29)
Calcium: 9.4 mg/dL (ref 8.7–10.2)
Chloride: 104 mmol/L (ref 96–106)
Creatinine, Ser: 1.16 mg/dL (ref 0.76–1.27)
Globulin, Total: 2.4 g/dL (ref 1.5–4.5)
Glucose: 92 mg/dL (ref 70–99)
Potassium: 4.5 mmol/L (ref 3.5–5.2)
Sodium: 139 mmol/L (ref 134–144)
Total Protein: 7.2 g/dL (ref 6.0–8.5)
eGFR: 73 mL/min/{1.73_m2} (ref >59)

## 2021-10-17 LAB — CBC
Hematocrit: 39.7 % (ref 37.5–51.0)
Hemoglobin: 13.5 g/dL (ref 13.0–17.7)
MCH: 32.6 pg (ref 26.6–33.0)
MCHC: 34 g/dL (ref 31.5–35.7)
MCV: 96 fL (ref 79–97)
Platelets: 190 10*3/uL (ref 150–450)
RBC: 4.14 x10E6/uL (ref 4.14–5.80)
RDW: 12.6 % (ref 11.6–15.4)
WBC: 7.8 10*3/uL (ref 3.4–10.8)

## 2021-10-17 LAB — TSH: TSH: 1.75 u[IU]/mL (ref 0.450–4.500)

## 2021-10-17 LAB — LIPID PANEL
Chol/HDL Ratio: 1.8 ratio (ref 0.0–5.0)
Cholesterol, Total: 118 mg/dL (ref 100–199)
HDL: 65 mg/dL (ref >39)
LDL Chol Calc (NIH): 40 mg/dL (ref 0–99)
Triglycerides: 62 mg/dL (ref 0–149)
VLDL Cholesterol Cal: 13 mg/dL (ref 5–40)

## 2021-10-17 LAB — PRO B NATRIURETIC PEPTIDE: NT-Pro BNP: 117 pg/mL (ref 0–210)

## 2021-10-18 ENCOUNTER — Encounter: Payer: Self-pay | Admitting: Cardiovascular Disease

## 2021-10-27 ENCOUNTER — Other Ambulatory Visit: Payer: Self-pay | Admitting: Cardiovascular Disease

## 2022-02-01 ENCOUNTER — Other Ambulatory Visit: Payer: Self-pay | Admitting: Cardiovascular Disease

## 2022-03-24 ENCOUNTER — Ambulatory Visit (HOSPITAL_COMMUNITY): Payer: BC Managed Care – PPO | Attending: Cardiovascular Disease

## 2022-03-24 DIAGNOSIS — I5020 Unspecified systolic (congestive) heart failure: Secondary | ICD-10-CM | POA: Insufficient documentation

## 2022-03-24 LAB — ECHOCARDIOGRAM COMPLETE
Area-P 1/2: 4.39 cm2
S' Lateral: 4.1 cm

## 2022-03-24 MED ORDER — PERFLUTREN LIPID MICROSPHERE
2.0000 mL | INTRAVENOUS | Status: AC | PRN
  Administered 2022-03-24: 2 mL via INTRAVENOUS

## 2022-03-29 ENCOUNTER — Other Ambulatory Visit: Payer: Self-pay | Admitting: Cardiovascular Disease

## 2022-04-07 ENCOUNTER — Encounter: Payer: Self-pay | Admitting: Cardiovascular Disease

## 2022-04-07 ENCOUNTER — Ambulatory Visit: Payer: BC Managed Care – PPO | Attending: Cardiovascular Disease | Admitting: Cardiovascular Disease

## 2022-04-07 DIAGNOSIS — I255 Ischemic cardiomyopathy: Secondary | ICD-10-CM | POA: Diagnosis not present

## 2022-04-07 DIAGNOSIS — E785 Hyperlipidemia, unspecified: Secondary | ICD-10-CM

## 2022-04-07 DIAGNOSIS — U071 COVID-19: Secondary | ICD-10-CM

## 2022-04-07 DIAGNOSIS — I5020 Unspecified systolic (congestive) heart failure: Secondary | ICD-10-CM | POA: Diagnosis not present

## 2022-04-07 DIAGNOSIS — I2102 ST elevation (STEMI) myocardial infarction involving left anterior descending coronary artery: Secondary | ICD-10-CM

## 2022-04-07 DIAGNOSIS — Z720 Tobacco use: Secondary | ICD-10-CM

## 2022-04-07 DIAGNOSIS — I10 Essential (primary) hypertension: Secondary | ICD-10-CM

## 2022-04-07 MED ORDER — EMPAGLIFLOZIN 10 MG PO TABS: 10.0000 mg | ORAL_TABLET | Freq: Every day | ORAL | 3 refills | Status: DC

## 2022-04-07 NOTE — Progress Notes (Signed)
Cardiology Office Note    Date:  04/12/2022   ID:  Cory Berry, DOB 1961/02/28, MRN [id number]  PCP:  Pcp, No  Cardiologist:  Shelva Majestic, MD   6 month F/u  office visit   History of Present Illness:  Cory Berry is a 61 y.o. male who presents for a 6 month follow-up cardiology evaluation   Cory Berry is originally from the [place] and has been living in the [place] for approximately 13 years.  On February 10, 2019 he developed new onset chest pain, and ECG showed ST elevation in V5 and V6 and a code STEMI was activated.  He underwent emergent cardiac catheterization and intervention by me and was found to have total occlusion of the proximal LAD.  There was 40% first marginal stenosis and he had a dominant RCA with luminal regularity.  He underwent successful emergent PCI to his LAD with ultimate insertion of a 3.0 x 32 mm Synergy DES stent postdilated to 3.3-3.25 taper with stenosis improved to 0% and resumption of TIMI-3 flow.  An in-hospital echo Doppler study on February 10, 2019 showed EF of 35 to 40%.   He was subsequently evaluated in the office on February 23, 2019 by Almyra Deforest, [place].  At that time he was stable without recurrent chest pain.  He was on aspirin/Brilinta for DAPT.  Losartan 25 mg was added to his regimen of Toprol-XL 25 mg.  He has tolerated that well.  He has continued to be on atorvastatin 80 mg.  He was referred for follow-up echo Doppler study which was recently completed on June 21, 1999.  EF remains low at 30 to 35% and there was grade 1 diastolic dysfunction.  There were regional wall motion abnormalities.  I saw him for his initial evaluation with me in the office on July 05, 2019.  At that time he denied any recurrent chest pain PND orthopnea or palpitations.  Historically, he had started smoking as a teenager and after smoking for many years had quit smoking for over 23 years.  Unfortunately he had restarted smoking 10 years ago.  He had  been smoking up to 2 packs/day and since his myocardial infarction he has significantly reduced his smoking to less than 1 pack/day for continues to smoke.  When his initial evaluation, with his reduced LV function I recommended transition to [facility] and provided him with samples of 24/26 mg twice a day 2 weeks and and recommended he discontinue losartan.  I recommended follow-up with our pharmacy clinic.  When I saw him in follow-up in May 2021 he was on Entresto 49/51 mg twice a day, Toprol-XL 25 mg in addition to aspirin and Brilinta and atorvastatin 80 mg.  He has felt well.  He denies chest pain or shortness of breath.  He wasreducing his cigarettes but unfortunately is still smoking.  He tells me he is contemplating a possible job promotion which may require him returning back to [place].  I saw him on December 21, 2019 and since his prior evaluation he turned down the job change to return to the [place].  He  continued to be asymptomatic without any heart failure symptoms and continues to be on Entresto 49/51 mg twice a day, metoprolol succinate 25 mg daily, spironolactone 12.5 mg.  He continued to be on DAPT with aspirin/ticagrelor 90 mg twice a day.  He felt well and denied any shortness of breath, PND or orthopnea.  Laboratory in May showed a proBNP that was normal at 172.  Creatinine had improved to 1.17.  Potassium was 4.5.  His insurance at work to change and there has been some confusion with reference to his deductible.  He was told that it would now cost him $500 per month.  He has reduced tobacco but unfortunately is still smoking some.  During that evaluation, we were able to provide him with the information so that he would have a new card with his new insurance to significantly reduce his deductible for Entresto.  He continued to be on atorvastatin 80 mg for hyperlipidemia.  I saw him on July 14, 2020 at which time he remained asymptomatic.  He denied  any shortness of  breath or chest pain or palpitations.  He continues to be on aspirin/Brilinta without bleeding.  He was on Entresto 49/51 mg twice a day, metoprolol succinate 25 mg and spironolactone 12.5 mg daily.  He continued to be on atorvastatin 80 mg.  He was planning to go to the [place] for family wedding in June.  I had recommended over the next several months that he had a repeat echo Doppler study for reassessment of LV size and function.  He underwent an echo Doppler study on Oct 09, 2020 which showed improvement in LV function now at 50 to 55% ejection fraction.  The left ventricular internal cavity remains mildly dilated.  He had normal diastolic parameters.  There was mild aortic sclerosis without stenosis.  There was mild dilation of his ascending aorta at 41 mm.  I saw him in January 12, 2021 at which time he continued to be stable.  Cory Berry went to the [place] in June and ended up testing positive for COVID.  He had mild symptoms and had been vaccinated.  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  He denies any chest pain PND orthopnea.  He denies any significant shortness of breath.  During that evaluation, his resting pulse was in the upper 70s to 80s and I suggested slight additional titration of metoprolol succinate from 25 mg daily up to 37.5 mg daily and after 2 weeks depending upon heart rate increased to 50 mg.  I again discussed the importance of complete smoking cessation.  LDL cholesterol in February 2022 was excellent at 37 on atorvastatin 80 mg.  I last saw him on Oct 12, 2021 at which time he continued to be stable and denied any chest pain or shortness of breath.  He continues to be on aspirin/Brilinta for DAPT and was on Entresto 49/51 mg twice a day, spironolactone 12.5 mg and metoprolol succinate at 50 mg.  He continued to be active.  He has significantly reduced his tobacco use but still smokes  occasional cigarettes.  He denied presyncope or syncope, PND orthopnea.  He was euvolemic on exam.  I recommended fasting laboratory and reassessment of LV function in 6 months.  He underwent his subsequent echo Doppler study on March 24, 2022.  This now shows EF estimated 40 to 45% with wall motion normality involving the mid distal anterior septum, mid inferoseptal segment and apex.  Left ventricular diastolic parameters were normal.  RV systolic function was normal.  Valves were essentially normal.  There was mild dilation of ascending aorta 41 mm.  Since I last saw him, he traveled to the [place] where he caught a cold for 3 weeks.  Ultimately this resolved.  He has been walking approximately 3 miles per day.  His heart rate typically is in the 70s.  He denies chest pain or shortness of breath.  He continues to be on aspirin and Brilinta.  He is on Entresto 49/51 twice a day, metoprolol succinate 50 mg daily and spironolactone 25 mg.  He is unaware of palpitations presyncope or syncope.  He presents for evaluation.  Past Medical History:  Diagnosis Date   Asthma     Past Surgical History:  Procedure Laterality Date   CORONARY STENT INTERVENTION N/A 02/10/2019   Procedure: CORONARY STENT INTERVENTION;  Surgeon: Troy Sine, MD;  Location: [place];  Service: Cardiovascular;  Laterality: N/A;   CORONARY/GRAFT ACUTE MI REVASCULARIZATION N/A 02/10/2019   Procedure: Coronary/Graft Acute MI Revascularization;  Surgeon: Troy Sine, MD;  Location: [place] [facility];  Service: Cardiovascular;  Laterality: N/A;   LEFT HEART CATH AND CORONARY ANGIOGRAPHY N/A 02/10/2019   Procedure: LEFT HEART CATH AND CORONARY ANGIOGRAPHY;  Surgeon: Troy Sine, MD;  Location: [facility];  Service: Cardiovascular;  Laterality: N/A;   NECK SURGERY      Current Medications: Outpatient Medications Prior to Visit  Medication Sig Dispense Refill   albuterol (VENTOLIN HFA) 108 (90 Base) MCG/ACT  inhaler Inhale 1-2 puffs into the lungs every 6 (six) hours as needed for wheezing or shortness of breath.     aspirin 81 MG chewable tablet Chew 1 tablet (81 mg total) by mouth daily. 30 tablet 12   atorvastatin (LIPITOR) 80 MG tablet TAKE 1 TABLET BY MOUTH EVERY DAY AT 6PM 90 tablet 1   nitroGLYCERIN (NITROSTAT) 0.4 MG SL tablet PLACE 1 TABLET UNDER THE TONGUE EVERY 5 MINUTES FOIR 3 DOSES AS NEEDED 25 tablet 11   sacubitril-valsartan (ENTRESTO) 49-51 MG Take 1 tablet by mouth 2 (two) times daily. 180 tablet 3   spironolactone (ALDACTONE) 25 MG tablet TAKE 1/2 TABLET BY MOUTH EVERY DAY 45 tablet 3   ticagrelor (BRILINTA) 90 MG TABS tablet TAKE 1 TABLET BY MOUTH TWICE A DAY 180 tablet 3   metoprolol succinate (TOPROL-XL) 50 MG 24 hr tablet Take 1 tablet (50 mg total) by mouth daily. 90 tablet 3   No facility-administered medications prior to visit.     Allergies:   Patient has no known allergies.   Social History   Socioeconomic History   Marital status: Married    Spouse name: Not on file   Number of children: Not on file   Years of education: Not on file   Highest education level: Not on file  Occupational History   Not on file  Tobacco Use   Smoking status: Every Day   Smokeless tobacco: Never  Substance and Sexual Activity   Alcohol use: Yes   Drug use: Never   Sexual activity: Not on file  Other Topics Concern   Not on file  Social History Narrative   Not on file   Social Determinants of Health   Financial Resource Strain: Not on file  Food Insecurity: Not on file  Transportation Needs: Not on file  Physical Activity: Not on file  Stress: Not on file  Social Connections: Not on file    Socially he was born in the [place].  He moved to [place] approximately 10 years ago.  Over the last several years due to his job he moved to the [place] area.  He currently works as a Field seismologist in  a factory and typically walks 3 miles per day at work.  Family  History: Family history is negative for heart disease  ROS General: Negative; No fevers, chills, or night sweats;  HEENT: Negative; No changes in vision or hearing, sinus congestion, difficulty swallowing Pulmonary: Negative; No cough, wheezing, shortness of breath, hemoptysis Cardiovascular: Negative; No chest pain, presyncope, syncope, palpitations GI: Negative; No nausea, vomiting, diarrhea, or abdominal pain GU: Negative; No dysuria, hematuria, or difficulty voiding Musculoskeletal: Negative; no myalgias, joint pain, or weakness Hematologic/Oncology: Negative; no easy bruising, bleeding Endocrine: Negative; no heat/cold intolerance; no diabetes Neuro: Negative; no changes in balance, headaches Skin: Negative; No rashes or skin lesions Psychiatric: Negative; No behavioral problems, depression Sleep: Negative; No snoring, daytime sleepiness, hypersomnolence, bruxism, restless legs, hypnogognic hallucinations, no cataplexy Other comprehensive 14 point system review is negative.   PHYSICAL EXAM:   VS:  BP 124/68   Pulse 78   Ht _0  (1.702 m)   Wt 209 lb 9.6 oz (95.1 kg)   SpO2 98%   BMI 32.83 kg/m     Repeat blood pressure by me was 110/60.  Wt Readings from Last 3 Encounters:  04/07/22 209 lb 9.6 oz (95.1 kg)  10/12/21 209 lb 12.8 oz (95.2 kg)  01/12/21 200 lb (90.7 kg)     General: Alert, oriented, no distress.  Euvolemic on exam Skin: normal turgor, no rashes, warm and dry HEENT: Normocephalic, atraumatic. Pupils equal round and reactive to light; sclera anicteric; extraocular muscles intact;  Nose without nasal septal hypertrophy Mouth/Parynx benign; Mallinpatti scale 3 Neck: No JVD, no carotid bruits; normal carotid upstroke Lungs: clear to ausculatation and percussion; no wheezing or rales Chest wall: without tenderness to palpitation Heart: PMI not displaced, RRR, s1 s2 normal, 1/6 systolic murmur, no diastolic murmur, no rubs, gallops, thrills, or  heaves Abdomen: soft, nontender; no hepatosplenomehaly, BS+; abdominal aorta nontender and not dilated by palpation. Back: no CVA tenderness Pulses 2+ Musculoskeletal: full range of motion, normal strength, no joint deformities Extremities: no clubbing cyanosis or edema, Homan's sign negative  Neurologic: grossly nonfocal; Cranial nerves grossly wnl Psychologic: Normal mood and affect   Studies/Labs Reviewed:    April 07, 2022 ECG (independently read by me): NSR at 78, PRWP  Oct 12, 2021 ECG (independently read by me): NSR at 79, QS V1-V3  January 12, 2022 ECG (independently read by me):  NSR at 79, QS V1-2, no ectopy    July 14, 2020 ECG (independently read by me): NSR at 75, QS V1-3, no ectopy  December 21, 2019 ECG (independently read by me): Normal sinus rhythm at 75 bpm.  QS complex V1 through V4.  No ectopy.  Normal intervals.  May 2021  ECG (independently read by me): Normal sinus rhythm at 77 bpm.  Probable biatrial enlargement.  QS complex V1 through V4 consistent with his anterior wall myocardial infarction.  Normal intervals.  Recent Labs:    Latest Ref Rng & Units 10/16/2021    8:09 AM 07/10/2020    8:07 AM 10/05/2019    3:23 PM  BMP  Glucose 70 - 99 mg/dL 92  89  82   BUN 6 - 24 mg/dL _1 Creatinine 0.76 - 1.27 mg/dL 1.16  1.08  1.17   BUN/Creat Ratio 9 - _2 Sodium 134 - 144 mmol/L 139  138  141   Potassium 3.5 - 5.2 mmol/L 4.5  4.9  4.5  Chloride 96 - 106 mmol/L 104  101  99   CO2 20 - 29 mmol/L _0 Calcium 8.7 - 10.2 mg/dL 9.4  9.5  9.8         Latest Ref Rng & Units 10/16/2021    8:09 AM 07/10/2020    8:07 AM 07/18/2019    9:27 AM  Hepatic Function  Total Protein 6.0 - 8.5 g/dL 7.2  7.0  6.8   Albumin 3.8 - 4.9 g/dL 4.8  4.4  3.9   AST 0 - 40 IU/L _1 ALT 0 - 44 IU/L _2 Alk Phosphatase 44 - 121 IU/L 77  70  84   Total Bilirubin 0.0 - 1.2 mg/dL 0.4  0.4  0.2        Latest Ref Rng & Units 10/16/2021     8:09 AM 07/10/2020    8:07 AM 02/11/2019    2:37 AM  CBC  WBC 3.4 - 10.8 x10E3/uL 7.8  6.2  9.7   Hemoglobin 13.0 - 17.7 g/dL 13.5  13.3  13.4   Hematocrit 37.5 - 51.0 % 39.7  39.6  40.6   Platelets 150 - 450 x10E3/uL 190  247  194    Lab Results  Component Value Date   MCV 96 10/16/2021   MCV 96 07/10/2020   MCV 97.1 02/11/2019   Lab Results  Component Value Date   TSH 1.750 10/16/2021   No results found for: "HGBA1C"   BNP No results found for: "BNP"  ProBNP    Component Value Date/Time   PROBNP 117 10/16/2021 0809     Lipid Panel     Component Value Date/Time   CHOL 118 10/16/2021 0809   TRIG 62 10/16/2021 0809   HDL 65 10/16/2021 0809   CHOLHDL 1.8 10/16/2021 0809   CHOLHDL 3.8 02/10/2019 0358   VLDL 63 (H) 02/10/2019 0358   LDLCALC 40 10/16/2021 0809   LABVLDL 13 10/16/2021 0809     RADIOLOGY: ECHOCARDIOGRAM COMPLETE  Result Date: 03/24/2022    ECHOCARDIOGRAM REPORT   Patient Name:   Cory Berry Date of Exam: 03/24/2022 Medical Rec #:  [id number]     Height:       67.0 in Accession #:    [id number]    Weight:       209.8 lb Date of Birth:  02/02/1961     BSA:          2.064 m Patient Age:    25 years      BP:           138/81 mmHg Patient Gender: M             HR:           92 bpm. Exam Location:  [address] Procedure: 2D Echo, Cardiac Doppler, Color Doppler and Intracardiac            Opacification Agent Indications:    I50.9 CHF  History:        Patient has prior history of Echocardiogram examinations, most                 recent 10/09/2020. CHF; Risk Factors:Hypertension, Dyslipidemia                 and Current Smoker.  Sonographer:    Coralyn Helling RDCS Referring Phys: [place]  Sonographer Comments: Technically difficult study due  to poor echo windows. IMPRESSIONS  1. Left ventricular ejection fraction, by estimation, is 40 to 45%. The left ventricle has mildly decreased function. The left ventricle demonstrates regional wall motion  abnormalities (see scoring diagram/findings for description). Left ventricular diastolic parameters were normal.  2. Right ventricular systolic function is normal. The right ventricular size is normal. There is normal pulmonary artery systolic pressure. The estimated right ventricular systolic pressure is 26.7 mmHg.  3. The mitral valve is grossly normal. Trivial mitral valve regurgitation. No evidence of mitral stenosis.  4. The aortic valve is tricuspid. Aortic valve regurgitation is not visualized. No aortic stenosis is present.  5. There is mild dilatation of the ascending aorta, measuring 41 mm.  6. The inferior vena cava is normal in size with greater than 50% respiratory variability, suggesting right atrial pressure of 3 mmHg. Comparison(s): Changes from prior study are noted. EF 40-45%. Septal hypokinesis more apparent on contrast images. Conclusion(s)/Recommendation(s): Findings consistent with ischemic cardiomyopathy. FINDINGS  Left Ventricle: Left ventricular ejection fraction, by estimation, is 40 to 45%. The left ventricle has mildly decreased function. The left ventricle demonstrates regional wall motion abnormalities. Definity contrast agent was given IV to delineate the left ventricular endocardial borders. The left ventricular internal cavity size was normal in size. There is no left ventricular hypertrophy. Left ventricular diastolic parameters were normal.  LV Wall Scoring: The mid and distal anterior septum, mid inferoseptal segment, and apex are hypokinetic. Right Ventricle: The right ventricular size is normal. No increase in right ventricular wall thickness. Right ventricular systolic function is normal. There is normal pulmonary artery systolic pressure. The tricuspid regurgitant velocity is 2.60 m/s, and  with an assumed right atrial pressure of 3 mmHg, the estimated right ventricular systolic pressure is 12.4 mmHg. Left Atrium: Left atrial size was normal in size. Right Atrium: Right atrial  size was normal in size. Pericardium: Trivial pericardial effusion is present. Mitral Valve: The mitral valve is grossly normal. Trivial mitral valve regurgitation. No evidence of mitral valve stenosis. Tricuspid Valve: The tricuspid valve is grossly normal. Tricuspid valve regurgitation is trivial. No evidence of tricuspid stenosis. Aortic Valve: The aortic valve is tricuspid. Aortic valve regurgitation is not visualized. No aortic stenosis is present. Pulmonic Valve: The pulmonic valve was grossly normal. Pulmonic valve regurgitation is not visualized. No evidence of pulmonic stenosis. Aorta: The aortic root is normal in size and structure. There is mild dilatation of the ascending aorta, measuring 41 mm. Venous: The inferior vena cava is normal in size with greater than 50% respiratory variability, suggesting right atrial pressure of 3 mmHg. IAS/Shunts: The atrial septum is grossly normal.  LEFT VENTRICLE PLAX 2D LVIDd:         5.80 cm   Diastology LVIDs:         4.10 cm   LV e' medial:    12.40 cm/s LV PW:         0.80 cm   LV E/e' medial:  7.7 LV IVS:        0.90 cm   LV e' lateral:   11.90 cm/s LVOT diam:     2.00 cm   LV E/e' lateral: 8.1 LV SV:         62 LV SV Index:   30 LVOT Area:     3.14 cm  RIGHT VENTRICLE             IVC RV S prime:     17.00 cm/s  IVC diam: 1.50 cm TAPSE (  M-mode): 2.6 cm RVSP:           30.0 mmHg LEFT ATRIUM             Index        RIGHT ATRIUM           Index LA diam:        3.70 cm 1.79 cm/m   RA Pressure: 3.00 mmHg LA Vol (A2C):   53.3 ml 25.83 ml/m  RA Area:     15.40 cm LA Vol (A4C):   51.3 ml 24.86 ml/m  RA Volume:   46.30 ml  22.43 ml/m LA Biplane Vol: 52.4 ml 25.39 ml/m  AORTIC VALVE LVOT Vmax:   96.60 cm/s LVOT Vmean:  64.800 cm/s LVOT VTI:    0.197 m  AORTA Ao Root diam: 3.60 cm Ao Asc diam:  4.10 cm MITRAL VALVE                TRICUSPID VALVE MV Area (PHT): 4.39 cm     TR Peak grad:   27.0 mmHg MV Decel Time: 173 msec     TR Vmax:        260.00 cm/s MV E velocity:  95.90 cm/s   Estimated RAP:  3.00 mmHg MV A velocity: 116.00 cm/s  RVSP:           30.0 mmHg MV E/A ratio:  0.83                             SHUNTS                             Systemic VTI:  0.20 m                             Systemic Diam: 2.00 cm Eleonore Chiquito MD Electronically signed by Eleonore Chiquito MD Signature Date/Time: 03/24/2022/4:20:59 PM    Final      Additional studies/ records that were reviewed today include:  I reviewed the patient's hospitalization records from September 2020.  I have reviewed catheterization findings, echo Doppler study, subsequent evaluation with Almyra Deforest, PA, and most recent echo Doppler evaluation from June 21, 2019.  ECHO: 10/09/2020 IMPRESSIONS   1. Left ventricular ejection fraction, by estimation, is 50 to 55%. The  left ventricle has low normal function. The left ventricle has no regional  wall motion abnormalities. The left ventricular internal cavity size was  mildly dilated. Left ventricular  diastolic parameters were normal.   2. Right ventricular systolic function is normal. The right ventricular  size is normal. There is normal pulmonary artery systolic pressure.   3. The mitral valve is normal in structure. Trivial mitral valve  regurgitation.   4. The aortic valve is tricuspid. Aortic valve regurgitation is trivial.  Mild aortic valve sclerosis is present, with no evidence of aortic valve  stenosis.   5. Aortic dilatation noted. There is mild dilatation of the ascending  aorta, measuring 41 mm.   6. The inferior vena cava is normal in size with greater than 50%  respiratory variability, suggesting right atrial pressure of 3 mmHg.   Comparison(s): Compared to prior TTE in 05/2019, the LVEF has  significantly improved to 50-55% with improved wall motion. Ascending  aorta now measures 43m (previously 312m.    ECHO: 03/24/2022 IMPRESSIONS   1. Left ventricular  ejection fraction, by estimation, is 40 to 45%. The  left ventricle has  mildly decreased function. The left ventricle  demonstrates regional wall motion abnormalities (see scoring  diagram/findings for description). Left ventricular  diastolic parameters were normal.   2. Right ventricular systolic function is normal. The right ventricular  size is normal. There is normal pulmonary artery systolic pressure. The  estimated right ventricular systolic pressure is 87.8 mmHg.   3. The mitral valve is grossly normal. Trivial mitral valve  regurgitation. No evidence of mitral stenosis.   4. The aortic valve is tricuspid. Aortic valve regurgitation is not  visualized. No aortic stenosis is present.   5. There is mild dilatation of the ascending aorta, measuring 41 mm.   6. The inferior vena cava is normal in size with greater than 50%  respiratory variability, suggesting right atrial pressure of 3 mmHg.   Comparison(s): Changes from prior study are noted. EF 40-45%. Septal  hypokinesis more apparent on contrast images.   Conclusion(s)/Recommendation(s): Findings consistent with ischemic  cardiomyopathy.    ASSESSMENT:    1. Acute ST elevation myocardial infarction (STEMI) due to occlusion of proximal portion of left anterior descending (LAD) coronary artery [facility]): February 10, 2019   2. HFrEF (heart failure with reduced ejection fraction) ([place])   3. Ischemic cardiomyopathy   4. Hyperlipidemia with target LDL less than 70   5. Primary hypertension   6. Tobacco abuse   7. COVID-19: June 2022     PLAN:  Cory Berry is a 61 year old gentleman originally from the [place] who denied any prior cardiac history but has a long history of tobacco use.  He presented with an acute coronary syndrome on February 10, 2019 and was found to have total proximal occlusion of a large LAD which was successfully stented.  In January 2021 and despite being on losartan 25 mg and Toprol-XL 25 mg an echo Doppler study continued to show reduced LV function with EF at 30 to  35% with grade 1 diastolic dysfunction.  There was mild dilation of his left ventricular.  He has successfully been transitioned to [facility] and remains euvolemic on 49/51 mg twice daily in addition to metoprolol succinate 25 mg and spironolactone 12.5 mg daily A proBNP on July 10, 2020 showed a normal level at 166.  His echo Doppler study from 01/09/2021 demonstrated improvement in LV function to an EF of 50 to 55%.    The LV remained mildly dilated.  There was mild aortic sclerosis without stenosis.  His ascending aorta was mildly dilated at 41 mm.  At his evaluation in August 2022 I recommended further titration of metoprolol succinate to 50 mg daily with resting heart rate in the 70s.  He continues to be on Entresto 49/51 mg daily, and spironolactone 12.5 mg.  He is euvolemic on exam.  He has continued to be on DAPT with aspirin/Brilinta and denies any bleeding.  His most recent echo Doppler study from March 24, 2022 shows an EF now at 40 to 45% with wall motion abnormalities secondary to his prior LAD infarction consistent with ischemic cardiomyopathy.  His blood pressure today by me was 110/60.  Rather than further titrate Entresto, I have elected to add SGLT2 inhibition and will start Jardiance 10 mg daily.  He will continue with his present dose of Entresto, metoprolol succinate 50 mg, and spironolactone 12.5 mg daily.  He continues to be on atorvastatin 80 mg for hyperlipidemia.  Most recent lipid studies from May  2023 shows total cholesterol 118, HDL 65, LDL 40 and triglycerides at 62.  Potassium was 4.5.  TSH was normal at 1.75.  I again discussed the importance of complete smoking cessation.  His weight is stable with mild obesity at 32.8.  I will see him in 4 months for reevaluation or sooner as needed.   Medication Adjustments/Labs and Tests Ordered: Current medicines are reviewed at length with the patient today.  Concerns regarding medicines are outlined above.  Medication changes, Labs and  Tests ordered today are listed in the Patient Instructions below. Patient Instructions  Medication Instructions:  START Jardiance 10 mg daily   *If you need a refill on your cardiac medications before your next appointment, please call your pharmacy*   Follow-Up: At [facility], you and your health needs are our priority.  As part of our continuing mission to provide you with exceptional heart care, we have created designated Provider Care Teams.  These Care Teams include your primary Cardiologist (physician) and Advanced Practice Providers (APPs -  Physician Assistants and Nurse Practitioners) who all work together to provide you with the care you need, when you need it.  We recommend signing up for the patient portal called "MyChart".  Sign up information is provided on this After Visit Summary.  MyChart is used to connect with patients for Virtual Visits (Telemedicine).  Patients are able to view lab/test results, encounter notes, upcoming appointments, etc.  Non-urgent messages can be sent to your provider as well.   To learn more about what you can do with MyChart, go to [email or web address].    Your next appointment:   4 month(s)  The format for your next appointment:   In Person  Provider:   Shelva Majestic, MD          Signed, Shelva Majestic, MD  04/12/2022 5:57 PM    [facility] [address] [place], [place], [place]  [address] Phone: [phone]

## 2022-04-07 NOTE — Patient Instructions (Signed)
Medication Instructions:  START Jardiance 10 mg daily   *If you need a refill on your cardiac medications before your next appointment, please call your pharmacy*   Follow-Up: At [facility], you and your health needs are our priority.  As part of our continuing mission to provide you with exceptional heart care, we have created designated Provider Care Teams.  These Care Teams include your primary Cardiologist (physician) and Advanced Practice Providers (APPs -  Physician Assistants and Nurse Practitioners) who all work together to provide you with the care you need, when you need it.  We recommend signing up for the patient portal called "MyChart".  Sign up information is provided on this After Visit Summary.  MyChart is used to connect with patients for Virtual Visits (Telemedicine).  Patients are able to view lab/test results, encounter notes, upcoming appointments, etc.  Non-urgent messages can be sent to your provider as well.   To learn more about what you can do with MyChart, go to [email or web address].    Your next appointment:   4 month(s)  The format for your next appointment:   In Person  Provider:   Nicki Guadalajara, MD

## 2022-04-08 ENCOUNTER — Other Ambulatory Visit: Payer: Self-pay | Admitting: Cardiovascular Disease

## 2022-04-12 ENCOUNTER — Encounter: Payer: Self-pay | Admitting: Cardiovascular Disease

## 2022-07-02 ENCOUNTER — Other Ambulatory Visit: Payer: Self-pay | Admitting: Cardiovascular Disease

## 2022-07-12 ENCOUNTER — Other Ambulatory Visit: Payer: Self-pay

## 2022-07-12 MED ORDER — ATORVASTATIN CALCIUM 80 MG PO TABS: ORAL_TABLET | ORAL | 1 refills | Status: DC

## 2022-08-10 DIAGNOSIS — L03012 Cellulitis of left finger: Secondary | ICD-10-CM | POA: Diagnosis not present

## 2022-08-16 ENCOUNTER — Encounter: Payer: Self-pay | Admitting: Cardiovascular Disease

## 2022-08-16 ENCOUNTER — Ambulatory Visit: Payer: BC Managed Care – PPO | Attending: Cardiovascular Disease | Admitting: Cardiovascular Disease

## 2022-08-16 DIAGNOSIS — I5020 Unspecified systolic (congestive) heart failure: Secondary | ICD-10-CM | POA: Diagnosis not present

## 2022-08-16 DIAGNOSIS — I2102 ST elevation (STEMI) myocardial infarction involving left anterior descending coronary artery: Secondary | ICD-10-CM

## 2022-08-16 DIAGNOSIS — Z720 Tobacco use: Secondary | ICD-10-CM

## 2022-08-16 DIAGNOSIS — I10 Essential (primary) hypertension: Secondary | ICD-10-CM

## 2022-08-16 DIAGNOSIS — I255 Ischemic cardiomyopathy: Secondary | ICD-10-CM | POA: Diagnosis not present

## 2022-08-16 DIAGNOSIS — E785 Hyperlipidemia, unspecified: Secondary | ICD-10-CM

## 2022-08-16 NOTE — Patient Instructions (Signed)
Medication Instructions:   No changes  *If you need a refill on your cardiac medications before your next appointment, please call your pharmacy*   Lab Work: Not needed    Testing/Procedures: Not needed   Follow-Up: At [facility], you and your health needs are our priority.  As part of our continuing mission to provide you with exceptional heart care, we have created designated Provider Care Teams.  These Care Teams include your primary Cardiologist (physician) and Advanced Practice Providers (APPs -  Physician Assistants and Nurse Practitioners) who all work together to provide you with the care you need, when you need it.     Your next appointment:   6 month(s)  The format for your next appointment:   In Person  Provider:   Judice Kelly, MD     

## 2022-08-16 NOTE — Progress Notes (Signed)
Cardiology Office Note    Date:  08/20/2022   ID:  Cory Berry, DOB Feb 23, 1961, MRN [id number]  PCP:  Pcp, No  Cardiologist:  Shelva Majestic, MD   6 month F/u  office visit   History of Present Illness:  Cory Berry is a 62 y.o. male who presents for a 6 month follow-up cardiology evaluation   Cory Berry is originally from the [place] and has been living in the [place] for approximately 13 years.  On February 10, 2019 he developed new onset chest pain, and ECG showed ST elevation in V5 and V6 and a code STEMI was activated.  He underwent emergent cardiac catheterization and intervention by me and was found to have total occlusion of the proximal LAD.  There was 40% first marginal stenosis and he had a dominant RCA with luminal regularity.  He underwent successful emergent PCI to his LAD with ultimate insertion of a 3.0 x 32 mm Synergy DES stent postdilated to 3.3-3.25 taper with stenosis improved to 0% and resumption of TIMI-3 flow.  An in-hospital echo Doppler study on February 10, 2019 showed EF of 35 to 40%.   He was subsequently evaluated in the office on February 23, 2019 by Almyra Deforest, [place].  At that time he was stable without recurrent chest pain.  He was on aspirin/Brilinta for DAPT.  Losartan 25 mg was added to his regimen of Toprol-XL 25 mg.  He has tolerated that well.  He has continued to be on atorvastatin 80 mg.  He was referred for follow-up echo Doppler study which was recently completed on June 21, 1999.  EF remains low at 30 to 35% and there was grade 1 diastolic dysfunction.  There were regional wall motion abnormalities.  I saw him for his initial evaluation with me in the office on July 05, 2019.  At that time he denied any recurrent chest pain PND orthopnea or palpitations.  Historically, he had started smoking as a teenager and after smoking for many years had quit smoking for over 23 years.  Unfortunately he had restarted smoking 10 years ago.  He had  been smoking up to 2 packs/day and since his myocardial infarction he has significantly reduced his smoking to less than 1 pack/day for continues to smoke.  When his initial evaluation, with his reduced LV function I recommended transition to [facility] and provided him with samples of 24/26 mg twice a day 2 weeks and and recommended he discontinue losartan.  I recommended follow-up with our pharmacy clinic.  When I saw him in follow-up in May 2021 he was on Entresto 49/51 mg twice a day, Toprol-XL 25 mg in addition to aspirin and Brilinta and atorvastatin 80 mg.  He has felt well.  He denies chest pain or shortness of breath.  He wasreducing his cigarettes but unfortunately is still smoking.  He tells me he is contemplating a possible job promotion which may require him returning back to [place].  I saw him on December 21, 2019 and since his prior evaluation he turned down the job change to return to the [place].  He  continued to be asymptomatic without any heart failure symptoms and continues to be on Entresto 49/51 mg twice a day, metoprolol succinate 25 mg daily, spironolactone 12.5 mg.  He continued to be on DAPT with aspirin/ticagrelor 90 mg twice a day.  He felt well and denied any shortness of breath, PND or orthopnea.  Laboratory in May showed a proBNP that was normal at 172.  Creatinine had improved to 1.17.  Potassium was 4.5.  His insurance at work to change and there has been some confusion with reference to his deductible.  He was told that it would now cost him $500 per month.  He has reduced tobacco but unfortunately is still smoking some.  During that evaluation, we were able to provide him with the information so that he would have a new card with his new insurance to significantly reduce his deductible for Entresto.  He continued to be on atorvastatin 80 mg for hyperlipidemia.  I saw him on July 14, 2020 at which time he remained asymptomatic.  He denied  any shortness of  breath or chest pain or palpitations.  He continues to be on aspirin/Brilinta without bleeding.  He was on Entresto 49/51 mg twice a day, metoprolol succinate 25 mg and spironolactone 12.5 mg daily.  He continued to be on atorvastatin 80 mg.  He was planning to go to the [place] for family wedding in June.  I had recommended over the next several months that he had a repeat echo Doppler study for reassessment of LV size and function.  He underwent an echo Doppler study on Oct 09, 2020 which showed improvement in LV function now at 50 to 55% ejection fraction.  The left ventricular internal cavity remains mildly dilated.  He had normal diastolic parameters.  There was mild aortic sclerosis without stenosis.  There was mild dilation of his ascending aorta at 41 mm.  I saw him in January 12, 2021 at which time he continued to be stable.  Mr. Leichtman went to the [place] in June and ended up testing positive for COVID.  He had mild symptoms and had been vaccinated.  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  He denies any chest pain PND orthopnea.  He denies any significant shortness of breath.  During that evaluation, his resting pulse was in the upper 70s to 80s and I suggested slight additional titration of metoprolol succinate from 25 mg daily up to 37.5 mg daily and after 2 weeks depending upon heart rate increased to 50 mg.  I again discussed the importance of complete smoking cessation.  LDL cholesterol in February 2022 was excellent at 37 on atorvastatin 80 mg.  I saw him on Oct 12, 2021 at which time he continued to be stable and denied any chest pain or shortness of breath.  He continues to be on aspirin/Brilinta for DAPT and was on Entresto 49/51 mg twice a day, spironolactone 12.5 mg and metoprolol succinate at 50 mg.  He continued to be active.  He has significantly reduced his tobacco use but still smokes occasional  cigarettes.  He denied presyncope or syncope, PND orthopnea.  He was euvolemic on exam.  I recommended fasting laboratory and reassessment of LV function in 6 months.  He underwent his subsequent echo Doppler study on March 24, 2022.  This now shows EF estimated 40 to 45% with wall motion normality involving the mid distal anterior septum, mid inferoseptal segment and apex.  Left ventricular diastolic parameters were normal.  RV systolic function was normal.  Valves were essentially normal.  There was mild dilation of ascending aorta 41 mm.  I last saw him on April 07, 2022.  Since his prior evaluation he had where he caught a  cold for 3 weeks.  Ultimately this resolved.  He has been walking approximately 3 miles per day.  His heart rate typically is in the 70s.  He denies chest pain or shortness of breath.  He continues to be on aspirin and Brilinta.  He is on Entresto 49/51 twice a day, metoprolol succinate 50 mg daily and spironolactone 25 mg.  He is unaware of palpitations presyncope or syncope.  During that evaluation, I initiated Jardiance 10 mg daily.  I again discussed the importance of complete smoking cessation.  Since I last saw him, he has had purposeful weight loss from 209 pounds down to 179 pounds.  He feels much better with initiation of Jardiance 10 mg and continues to be on Entresto 49/51 mg twice a day, metoprolol succinate 50 mg, spironolactone 12.5 mg daily.  He continues to be on DAPT with aspirin/Brilinta.  He denies any chest pain or shortness of breath.  He has been very busy doing home repairs into rooms at home.  Previously had built a house in [place].  He presents for reevaluation.  Past Medical History:  Diagnosis Date   Asthma     Past Surgical History:  Procedure Laterality Date   CORONARY STENT INTERVENTION N/A 02/10/2019   Procedure: CORONARY STENT INTERVENTION;  Surgeon: Troy Sine, MD;  Location: [facility];  Service: Cardiovascular;  Laterality:  N/A;   CORONARY/GRAFT ACUTE MI REVASCULARIZATION N/A 02/10/2019   Procedure: Coronary/Graft Acute MI Revascularization;  Surgeon: Troy Sine, MD;  Location: [facility];  Service: Cardiovascular;  Laterality: N/A;   LEFT HEART CATH AND CORONARY ANGIOGRAPHY N/A 02/10/2019   Procedure: LEFT HEART CATH AND CORONARY ANGIOGRAPHY;  Surgeon: Troy Sine, MD;  Location: [facility];  Service: Cardiovascular;  Laterality: N/A;   NECK SURGERY      Current Medications: Outpatient Medications Prior to Visit  Medication Sig Dispense Refill   albuterol (VENTOLIN HFA) 108 (90 Base) MCG/ACT inhaler Inhale 1-2 puffs into the lungs every 6 (six) hours as needed for wheezing or shortness of breath.     aspirin 81 MG chewable tablet Chew 1 tablet (81 mg total) by mouth daily. 30 tablet 12   atorvastatin (LIPITOR) 80 MG tablet TAKE 1 TABLET BY MOUTH EVERY DAY AT 6PM 90 tablet 1   empagliflozin (JARDIANCE) 10 MG TABS tablet Take 1 tablet (10 mg total) by mouth daily before breakfast. 90 tablet 3   metoprolol succinate (TOPROL-XL) 50 MG 24 hr tablet TAKE 1 TABLET BY MOUTH EVERY DAY 90 tablet 3   nitroGLYCERIN (NITROSTAT) 0.4 MG SL tablet PLACE 1 TABLET UNDER THE TONGUE EVERY 5 MINUTES FOIR 3 DOSES AS NEEDED 25 tablet 11   sacubitril-valsartan (ENTRESTO) 49-51 MG Take 1 tablet by mouth 2 (two) times daily. 180 tablet 3   spironolactone (ALDACTONE) 25 MG tablet Take 0.5 tablets (12.5 mg total) by mouth daily. 45 tablet 0   ticagrelor (BRILINTA) 90 MG TABS tablet TAKE 1 TABLET BY MOUTH TWICE A DAY 180 tablet 3   No facility-administered medications prior to visit.     Allergies:   Patient has no known allergies.   Social History   Socioeconomic History   Marital status: Married    Spouse name: Not on file   Number of children: Not on file   Years of education: Not on file   Highest education level: Not on file  Occupational History   Not on file  Tobacco Use   Smoking  status: Every  Day   Smokeless tobacco: Never  Substance and Sexual Activity   Alcohol use: Yes   Drug use: Never   Sexual activity: Not on file  Other Topics Concern   Not on file  Social History Narrative   Not on file   Social Determinants of Health   Financial Resource Strain: Not on file  Food Insecurity: Not on file  Transportation Needs: Not on file  Physical Activity: Not on file  Stress: Not on file  Social Connections: Not on file    Socially he was born in the [place].  He moved to [place] approximately 10 years ago.  Over the last several years due to his job he moved to the [place] area.  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  Family History: Family history is negative for heart disease  ROS General: Negative; No fevers, chills, or night sweats;  HEENT: Negative; No changes in vision or hearing, sinus congestion, difficulty swallowing Pulmonary: Negative; No cough, wheezing, shortness of breath, hemoptysis Cardiovascular: Negative; No chest pain, presyncope, syncope, palpitations GI: Negative; No nausea, vomiting, diarrhea, or abdominal pain GU: Negative; No dysuria, hematuria, or difficulty voiding Musculoskeletal: Negative; no myalgias, joint pain, or weakness Hematologic/Oncology: Negative; no easy bruising, bleeding Endocrine: Negative; no heat/cold intolerance; no diabetes Neuro: Negative; no changes in balance, headaches Skin: Negative; No rashes or skin lesions Psychiatric: Negative; No behavioral problems, depression Sleep: Negative; No snoring, daytime sleepiness, hypersomnolence, bruxism, restless legs, hypnogognic hallucinations, no cataplexy Other comprehensive 14 point system review is negative.   PHYSICAL EXAM:   VS:  BP 126/66 (BP Location: Right Arm, Patient Position: Sitting, Cuff Size: Normal)   Pulse 67   Ht 5\' 7"  (1.702 m)   Wt 179 lb (81.2 kg)   BMI 28.04 kg/m     Repeat blood  pressure by me was 116/66  Wt Readings from Last 3 Encounters:  08/16/22 179 lb (81.2 kg)  04/07/22 209 lb 9.6 oz (95.1 kg)  10/12/21 209 lb 12.8 oz (95.2 kg)    General: Alert, oriented, no distress.  Skin: normal turgor, no rashes, warm and dry HEENT: Normocephalic, atraumatic. Pupils equal round and reactive to light; sclera anicteric; extraocular muscles intact;  Nose without nasal septal hypertrophy Mouth/Parynx benign; Mallinpatti scale 3 Neck: No JVD, no carotid bruits; normal carotid upstroke Lungs: clear to ausculatation and percussion; no wheezing or rales Chest wall: without tenderness to palpitation Heart: PMI not displaced, RRR, s1 s2 normal, 1/6 systolic murmur, no diastolic murmur, no rubs, gallops, thrills, or heaves Abdomen: soft, nontender; no hepatosplenomehaly, BS+; abdominal aorta nontender and not dilated by palpation. Back: no CVA tenderness Pulses 2+ Musculoskeletal: full range of motion, normal strength, no joint deformities Extremities: no clubbing cyanosis or edema, Homan's sign negative  Neurologic: grossly nonfocal; Cranial nerves grossly wnl Psychologic: Normal mood and affect    Studies/Labs Reviewed:    March 25, 2024ECG (independently read by me): NSR at 67, LAE, QS V1-3  April 07, 2022 ECG (independently read by me): NSR at 78, PRWP  Oct 12, 2021 ECG (independently read by me): NSR at 79, QS V1-V3  January 12, 2022 ECG (independently read by me):  NSR at 79, QS V1-2, no ectopy    July 14, 2020 ECG (independently read by me): NSR at 75, QS V1-3, no ectopy  December 21, 2019 ECG (independently read by me): Normal sinus rhythm at 75 bpm.  QS complex V1 through V4.  No ectopy.  Normal intervals.  May 2021  ECG (independently read by me): Normal sinus rhythm at 77 bpm.  Probable biatrial enlargement.  QS complex V1 through V4 consistent with his anterior wall myocardial infarction.  Normal intervals.  Recent Labs:    Latest Ref Rng & Units  10/16/2021    8:09 AM 07/10/2020    8:07 AM 10/05/2019    3:23 PM  BMP  Glucose 70 - 99 mg/dL 92  89  82   BUN 6 - 24 mg/dL 18  20  25    Creatinine 0.76 - 1.27 mg/dL 1.16  1.08  1.17   BUN/Creat Ratio 9 - 20 16  19  21    Sodium 134 - 144 mmol/L 139  138  141   Potassium 3.5 - 5.2 mmol/L 4.5  4.9  4.5   Chloride 96 - 106 mmol/L 104  101  99   CO2 20 - 29 mmol/L 22  21  23    Calcium 8.7 - 10.2 mg/dL 9.4  9.5  9.8         Latest Ref Rng & Units 10/16/2021    8:09 AM 07/10/2020    8:07 AM 07/18/2019    9:27 AM  Hepatic Function  Total Protein 6.0 - 8.5 g/dL 7.2  7.0  6.8   Albumin 3.8 - 4.9 g/dL 4.8  4.4  3.9   AST 0 - 40 IU/L 25  29  20    ALT 0 - 44 IU/L 30  28  20    Alk Phosphatase 44 - 121 IU/L 77  70  84   Total Bilirubin 0.0 - 1.2 mg/dL 0.4  0.4  0.2        Latest Ref Rng & Units 10/16/2021    8:09 AM 07/10/2020    8:07 AM 02/11/2019    2:37 AM  CBC  WBC 3.4 - 10.8 x10E3/uL 7.8  6.2  9.7   Hemoglobin 13.0 - 17.7 g/dL 13.5  13.3  13.4   Hematocrit 37.5 - 51.0 % 39.7  39.6  40.6   Platelets 150 - 450 x10E3/uL 190  247  194    Lab Results  Component Value Date   MCV 96 10/16/2021   MCV 96 07/10/2020   MCV 97.1 02/11/2019   Lab Results  Component Value Date   TSH 1.750 10/16/2021   No results found for: "HGBA1C"   BNP No results found for: "BNP"  ProBNP    Component Value Date/Time   PROBNP 117 10/16/2021 0809     Lipid Panel     Component Value Date/Time   CHOL 118 10/16/2021 0809   TRIG 62 10/16/2021 0809   HDL 65 10/16/2021 0809   CHOLHDL 1.8 10/16/2021 0809   CHOLHDL 3.8 02/10/2019 0358   VLDL 63 (H) 02/10/2019 0358   LDLCALC 40 10/16/2021 0809   LABVLDL 13 10/16/2021 0809     RADIOLOGY: No results found.   Additional studies/ records that were reviewed today include:  I reviewed the patient's hospitalization records from September 2020.  I have reviewed catheterization findings, echo Doppler study, subsequent evaluation with Almyra Deforest, PA,  and most recent echo Doppler evaluation from June 21, 2019.  ECHO: 10/09/2020 IMPRESSIONS   1. Left ventricular ejection fraction, by estimation, is 50 to 55%. The  left ventricle has low normal function. The left ventricle has no regional  wall motion abnormalities. The left ventricular internal cavity size was  mildly dilated. Left  ventricular  diastolic parameters were normal.   2. Right ventricular systolic function is normal. The right ventricular  size is normal. There is normal pulmonary artery systolic pressure.   3. The mitral valve is normal in structure. Trivial mitral valve  regurgitation.   4. The aortic valve is tricuspid. Aortic valve regurgitation is trivial.  Mild aortic valve sclerosis is present, with no evidence of aortic valve  stenosis.   5. Aortic dilatation noted. There is mild dilatation of the ascending  aorta, measuring 41 mm.   6. The inferior vena cava is normal in size with greater than 50%  respiratory variability, suggesting right atrial pressure of 3 mmHg.   Comparison(s): Compared to prior TTE in 05/2019, the LVEF has  significantly improved to 50-55% with improved wall motion. Ascending  aorta now measures 16mm (previously 44mm).    ECHO: 03/24/2022 IMPRESSIONS   1. Left ventricular ejection fraction, by estimation, is 40 to 45%. The  left ventricle has mildly decreased function. The left ventricle  demonstrates regional wall motion abnormalities (see scoring  diagram/findings for description). Left ventricular  diastolic parameters were normal.   2. Right ventricular systolic function is normal. The right ventricular  size is normal. There is normal pulmonary artery systolic pressure. The  estimated right ventricular systolic pressure is [id number] mmHg.   3. The mitral valve is grossly normal. Trivial mitral valve  regurgitation. No evidence of mitral stenosis.   4. The aortic valve is tricuspid. Aortic valve regurgitation is not  visualized. No  aortic stenosis is present.   5. There is mild dilatation of the ascending aorta, measuring 41 mm.   6. The inferior vena cava is normal in size with greater than 50%  respiratory variability, suggesting right atrial pressure of 3 mmHg.   Comparison(s): Changes from prior study are noted. EF 40-45%. Septal  hypokinesis more apparent on contrast images.   Conclusion(s)/Recommendation(s): Findings consistent with ischemic  cardiomyopathy.    ASSESSMENT:    1. Acute ST elevation myocardial infarction (STEMI) due to occlusion of proximal portion of left anterior descending (LAD) coronary artery [facility]): February 10, 2019   2. HFrEF (heart failure with reduced ejection fraction) ([place])   3. Ischemic cardiomyopathy   4. Primary hypertension   5. Hyperlipidemia with target LDL less than 70   6. Tobacco abuse     PLAN:  Cory Berry is a 62 year old gentleman originally from the [place] who denied any prior cardiac history but has a long history of tobacco use.  He presented with an acute coronary syndrome on February 10, 2019 and was found to have total proximal occlusion of a large LAD which was successfully stented.  In January 2021 and despite being on losartan 25 mg and Toprol-XL 25 mg an echo Doppler study continued to show reduced LV function with EF at 30 to 35% with grade 1 diastolic dysfunction.  There was mild dilation of his left ventricular.  He has successfully been transitioned to [facility] and remains euvolemic on 49/51 mg twice daily in addition to metoprolol succinate 25 mg and spironolactone 12.5 mg daily A proBNP on July 10, 2020 showed a normal level at 166.  His echo Doppler study from 01/09/2021 demonstrated improvement in LV function to an EF of 50 to 55%.    The LV remained mildly dilated.  There was mild aortic sclerosis without stenosis.  His ascending aorta was mildly dilated at 41 mm.  At his evaluation in August 2022 I recommended further  titration of  metoprolol succinate to 50 mg daily with resting heart rate in the 70s. His most recent echo Doppler study from March 24, 2022 shows an EF now at 40 to 45% with wall motion abnormalities secondary to his prior LAD infarction consistent with ischemic cardiomyopathy.  At his November 2023 evaluation Jardiance 10 mg was added to his Entresto 49/51 mg twice a day, metoprolol succinate 50 mg and spironolactone 12.5 mg daily.  He has continued to be on DAPT with aspirin/Brilinta.  He is on atorvastatin 80 mg for hyperlipidemia.  LDL cholesterol in May 2023 was 40 on treatment.  I commended him on his purposeful weight loss since his office visit in November 2023 he has lost 30 pounds.  His heart rate at home typically is in the 60s.  Blood pressure today is stable.  He still smokes although less, and I again discussed the importance of complete smoking cessation.  He will continue current therapy.  I will see him in 6 months for reevaluation or sooner as needed.  Medication Adjustments/Labs and Tests Ordered: Current medicines are reviewed at length with the patient today.  Concerns regarding medicines are outlined above.  Medication changes, Labs and Tests ordered today are listed in the Patient Instructions below. Patient Instructions  Medication Instructions:  No changes  *If you need a refill on your cardiac medications before your next appointment, please call your pharmacy*   Lab Work: Not needed    Testing/Procedures:   Not needed  Follow-Up: At [facility], you and your health needs are our priority.  As part of our continuing mission to provide you with exceptional heart care, we have created designated Provider Care Teams.  These Care Teams include your primary Cardiologist (physician) and Advanced Practice Providers (APPs -  Physician Assistants and Nurse Practitioners) who all work together to provide you with the care you need, when you need it.     Your next appointment:   6  month(s)  The format for your next appointment:   In Person  Provider:   Shelva Majestic, MD      Signed, Shelva Majestic, MD  08/20/2022 8:22 AM    [place] [address] [place], [place], [place]  [address] Phone: [phone]

## 2022-08-20 ENCOUNTER — Encounter: Payer: Self-pay | Admitting: Cardiovascular Disease

## 2022-09-12 ENCOUNTER — Other Ambulatory Visit: Payer: Self-pay | Admitting: Cardiovascular Disease

## 2022-09-12 ENCOUNTER — Encounter: Payer: Self-pay | Admitting: Cardiovascular Disease

## 2022-09-13 MED ORDER — ENTRESTO 49-51 MG PO TABS: 1.0000 | ORAL_TABLET | Freq: Two times a day (BID) | ORAL | 1 refills | Status: DC

## 2022-09-13 MED ORDER — SPIRONOLACTONE 25 MG PO TABS: 12.5000 mg | ORAL_TABLET | Freq: Every day | ORAL | 0 refills | Status: DC

## 2022-10-25 DIAGNOSIS — H52223 Regular astigmatism, bilateral: Secondary | ICD-10-CM | POA: Diagnosis not present

## 2022-10-25 DIAGNOSIS — H25813 Combined forms of age-related cataract, bilateral: Secondary | ICD-10-CM | POA: Diagnosis not present

## 2022-11-29 DIAGNOSIS — H25813 Combined forms of age-related cataract, bilateral: Secondary | ICD-10-CM | POA: Diagnosis not present

## 2022-12-02 DIAGNOSIS — I110 Hypertensive heart disease with heart failure: Secondary | ICD-10-CM | POA: Diagnosis not present

## 2022-12-02 DIAGNOSIS — H25813 Combined forms of age-related cataract, bilateral: Secondary | ICD-10-CM | POA: Diagnosis not present

## 2022-12-02 DIAGNOSIS — I2510 Atherosclerotic heart disease of native coronary artery without angina pectoris: Secondary | ICD-10-CM | POA: Diagnosis not present

## 2022-12-02 DIAGNOSIS — I252 Old myocardial infarction: Secondary | ICD-10-CM | POA: Diagnosis not present

## 2022-12-02 DIAGNOSIS — Z79899 Other long term (current) drug therapy: Secondary | ICD-10-CM | POA: Diagnosis not present

## 2022-12-02 DIAGNOSIS — Z7902 Long term (current) use of antithrombotics/antiplatelets: Secondary | ICD-10-CM | POA: Diagnosis not present

## 2022-12-02 DIAGNOSIS — F17200 Nicotine dependence, unspecified, uncomplicated: Secondary | ICD-10-CM | POA: Diagnosis not present

## 2022-12-02 DIAGNOSIS — H25812 Combined forms of age-related cataract, left eye: Secondary | ICD-10-CM | POA: Diagnosis not present

## 2022-12-02 DIAGNOSIS — H52223 Regular astigmatism, bilateral: Secondary | ICD-10-CM | POA: Diagnosis not present

## 2022-12-02 DIAGNOSIS — J449 Chronic obstructive pulmonary disease, unspecified: Secondary | ICD-10-CM | POA: Diagnosis not present

## 2022-12-02 DIAGNOSIS — I509 Heart failure, unspecified: Secondary | ICD-10-CM | POA: Diagnosis not present

## 2022-12-02 DIAGNOSIS — Z7982 Long term (current) use of aspirin: Secondary | ICD-10-CM | POA: Diagnosis not present

## 2022-12-11 ENCOUNTER — Other Ambulatory Visit: Payer: Self-pay | Admitting: Cardiovascular Disease

## 2022-12-14 DIAGNOSIS — H25811 Combined forms of age-related cataract, right eye: Secondary | ICD-10-CM | POA: Diagnosis not present

## 2022-12-14 DIAGNOSIS — H52223 Regular astigmatism, bilateral: Secondary | ICD-10-CM | POA: Diagnosis not present

## 2023-01-04 ENCOUNTER — Other Ambulatory Visit: Payer: Self-pay | Admitting: Cardiovascular Disease

## 2023-01-11 DIAGNOSIS — H25811 Combined forms of age-related cataract, right eye: Secondary | ICD-10-CM | POA: Diagnosis not present

## 2023-02-10 ENCOUNTER — Telehealth: Payer: Self-pay | Admitting: Cardiovascular Disease

## 2023-02-10 MED ORDER — TICAGRELOR 90 MG PO TABS: 90.0000 mg | ORAL_TABLET | Freq: Two times a day (BID) | ORAL | 1 refills | Status: DC

## 2023-02-10 NOTE — Telephone Encounter (Signed)
*  STAT* If patient is at the pharmacy, call can be transferred to refill team.   1. Which medications need to be refilled? (please list name of each medication and dose if known)   ticagrelor (BRILINTA) 90 MG TABS tablet    2. Which pharmacy/location (including street and city if local pharmacy) is medication to be sent to?[id number] - [place], [place] - [address]   3. Do they need a 30 day or 90 day supply? 90 day

## 2023-02-10 NOTE — Telephone Encounter (Signed)
Pt's medication was sent to pt's pharmacy as requested. Confirmation received.  °

## 2023-02-15 ENCOUNTER — Ambulatory Visit: Payer: BC Managed Care – PPO | Attending: Cardiovascular Disease | Admitting: Cardiovascular Disease

## 2023-02-15 ENCOUNTER — Encounter: Payer: Self-pay | Admitting: Cardiovascular Disease

## 2023-02-15 DIAGNOSIS — I10 Essential (primary) hypertension: Secondary | ICD-10-CM | POA: Diagnosis not present

## 2023-02-15 DIAGNOSIS — E785 Hyperlipidemia, unspecified: Secondary | ICD-10-CM | POA: Diagnosis not present

## 2023-02-15 DIAGNOSIS — I255 Ischemic cardiomyopathy: Secondary | ICD-10-CM | POA: Diagnosis not present

## 2023-02-15 DIAGNOSIS — Z720 Tobacco use: Secondary | ICD-10-CM

## 2023-02-15 DIAGNOSIS — I2102 ST elevation (STEMI) myocardial infarction involving left anterior descending coronary artery: Secondary | ICD-10-CM | POA: Diagnosis not present

## 2023-02-15 NOTE — Progress Notes (Signed)
Cardiology Office Note    Date:  02/15/2023   ID:  Cory Berry, DOB 1961-05-18, MRN [id number]  PCP:  Pcp, No  Cardiologist:  Nicki Guadalajara, MD   6 month F/u  office visit   History of Present Illness:  Cory Berry is a 62 y.o. male who presents for a 6 month follow-up cardiology evaluation   Mr. Cory Berry is originally from the [place] and has been living in the [place] for approximately 13 years.  On February 10, 2019 he developed new onset chest pain, and ECG showed ST elevation in V5 and V6 and a code STEMI was activated.  He underwent emergent cardiac catheterization and intervention by me and was found to have total occlusion of the proximal LAD.  There was 40% first marginal stenosis and he had a dominant RCA with luminal regularity.  He underwent successful emergent PCI to his LAD with ultimate insertion of a 3.0 x 32 mm Synergy DES stent postdilated to 3.3-3.25 taper with stenosis improved to 0% and resumption of TIMI-3 flow.  An in-hospital echo Doppler study on February 10, 2019 showed EF of 35 to 40%.   He was subsequently evaluated in the office on February 23, 2019 by Azalee Course, [place].  At that time he was stable without recurrent chest pain.  He was on aspirin/Brilinta for DAPT.  Losartan 25 mg was added to his regimen of Toprol-XL 25 mg.  He has tolerated that well.  He has continued to be on atorvastatin 80 mg.  He was referred for follow-up echo Doppler study which was recently completed on June 21, 1999.  EF remains low at 30 to 35% and there was grade 1 diastolic dysfunction.  There were regional wall motion abnormalities.  I saw him for his initial evaluation with me in the office on July 05, 2019.  At that time he denied any recurrent chest pain PND orthopnea or palpitations.  Historically, he had started smoking as a teenager and after smoking for many years had quit smoking for over 23 years.  Unfortunately he had restarted smoking 10 years ago.  He had  been smoking up to 2 packs/day and since his myocardial infarction he has significantly reduced his smoking to less than 1 pack/day for continues to smoke.  When his initial evaluation, with his reduced LV function I recommended transition to [facility] and provided him with samples of 24/26 mg twice a day 2 weeks and and recommended he discontinue losartan.  I recommended follow-up with our pharmacy clinic.  When I saw him in follow-up in May 2021 he was on Entresto 49/51 mg twice a day, Toprol-XL 25 mg in addition to aspirin and Brilinta and atorvastatin 80 mg.  He has felt well.  He denies chest pain or shortness of breath.  He wasreducing his cigarettes but unfortunately is still smoking.  He tells me he is contemplating a possible job promotion which may require him returning back to [place].  I saw him on December 21, 2019 and since his prior evaluation he turned down the job change to return to the [place].  He  continued to be asymptomatic without any heart failure symptoms and continues to be on Entresto 49/51 mg twice a day, metoprolol succinate 25 mg daily, spironolactone 12.5 mg.  He continued to be on DAPT with aspirin/ticagrelor 90 mg twice a day.  He felt well and denied any shortness of breath, PND or orthopnea.  Laboratory in May showed a proBNP that was normal at 172.  Creatinine had improved to 1.17.  Potassium was 4.5.  His insurance at work to change and there has been some confusion with reference to his deductible.  He was told that it would now cost him $500 per month.  He has reduced tobacco but unfortunately is still smoking some.  During that evaluation, we were able to provide him with the information so that he would have a new card with his new insurance to significantly reduce his deductible for Entresto.  He continued to be on atorvastatin 80 mg for hyperlipidemia.  I saw him on July 14, 2020 at which time he remained asymptomatic.  He denied  any shortness of  breath or chest pain or palpitations.  He continues to be on aspirin/Brilinta without bleeding.  He was on Entresto 49/51 mg twice a day, metoprolol succinate 25 mg and spironolactone 12.5 mg daily.  He continued to be on atorvastatin 80 mg.  He was planning to go to the [place] for family wedding in June.  I had recommended over the next several months that he had a repeat echo Doppler study for reassessment of LV size and function.  He underwent an echo Doppler study on Oct 09, 2020 which showed improvement in LV function now at 50 to 55% ejection fraction.  The left ventricular internal cavity remains mildly dilated.  He had normal diastolic parameters.  There was mild aortic sclerosis without stenosis.  There was mild dilation of his ascending aorta at 41 mm.  I saw him in January 12, 2021 at which time he continued to be stable.  Mr. Hedinger went to the [place] in June and ended up testing positive for COVID.  He had mild symptoms and had been vaccinated.  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  He denies any chest pain PND orthopnea.  He denies any significant shortness of breath.  During that evaluation, his resting pulse was in the upper 70s to 80s and I suggested slight additional titration of metoprolol succinate from 25 mg daily up to 37.5 mg daily and after 2 weeks depending upon heart rate increased to 50 mg.  I again discussed the importance of complete smoking cessation.  LDL cholesterol in February 2022 was excellent at 37 on atorvastatin 80 mg.  I saw him on Oct 12, 2021 at which time he continued to be stable and denied any chest pain or shortness of breath.  He continues to be on aspirin/Brilinta for DAPT and was on Entresto 49/51 mg twice a day, spironolactone 12.5 mg and metoprolol succinate at 50 mg.  He continued to be active.  He has significantly reduced his tobacco use but still smokes occasional  cigarettes.  He denied presyncope or syncope, PND orthopnea.  He was euvolemic on exam.  I recommended fasting laboratory and reassessment of LV function in 6 months.  He underwent his subsequent echo Doppler study on March 24, 2022.  This now shows EF estimated 40 to 45% with wall motion normality involving the mid distal anterior septum, mid inferoseptal segment and apex.  Left ventricular diastolic parameters were normal.  RV systolic function was normal.  Valves were essentially normal.  There was mild dilation of ascending aorta 41 mm.  I saw him on April 07, 2022.  Since his prior evaluation he had where he caught a cold  for 3 weeks.  Ultimately this resolved.  He has been walking approximately 3 miles per day.  His heart rate typically is in the 70s.  He denies chest pain or shortness of breath.  He continues to be on aspirin and Brilinta.  He is on Entresto 49/51 twice a day, metoprolol succinate 50 mg daily and spironolactone 25 mg.  He is unaware of palpitations presyncope or syncope.  During that evaluation, I initiated Jardiance 10 mg daily.  I again discussed the importance of complete smoking cessation.  I last saw him on August 16, 2022.  Since hisprior evaluation  he has had purposeful weight loss from 209 pounds down to 179 pounds.  He feels much better with initiation of Jardiance 10 mg and continues to be on Entresto 49/51 mg twice a day, metoprolol succinate 50 mg, spironolactone 12.5 mg daily.  He continues to be on DAPT with aspirin/Brilinta.  He denies any chest pain or shortness of breath.  He has been very busy doing home repairs into rooms at home.  Previously had built a house in [place].    Since I last saw him, Mr. Balbuena has continued to do well.  He spent 5 weeks in [place] and did visit his parents in [place] summer.  He has remained very active fixing several rooms in the house addition.  He has continued to be effective with weight loss and has lost an additional 10  pounds since his last evaluation.  He is now walking at least 4 miles per day without difficulty.  Smokes an occasional cigarette.  He denies chest tightness PND orthopnea palpitations, presyncope or syncope.  He has not had recent laboratory.  He presents for evaluation.  Past Medical History:  Diagnosis Date   Asthma     Past Surgical History:  Procedure Laterality Date   CORONARY STENT INTERVENTION N/A 02/10/2019   Procedure: CORONARY STENT INTERVENTION;  Surgeon: Lennette Bihari, MD;  Location: [facility];  Service: Cardiovascular;  Laterality: N/A;   CORONARY/GRAFT ACUTE MI REVASCULARIZATION N/A 02/10/2019   Procedure: Coronary/Graft Acute MI Revascularization;  Surgeon: Lennette Bihari, MD;  Location: [facility];  Service: Cardiovascular;  Laterality: N/A;   LEFT HEART CATH AND CORONARY ANGIOGRAPHY N/A 02/10/2019   Procedure: LEFT HEART CATH AND CORONARY ANGIOGRAPHY;  Surgeon: Lennette Bihari, MD;  Location: [facility];  Service: Cardiovascular;  Laterality: N/A;   NECK SURGERY      Current Medications: Outpatient Medications Prior to Visit  Medication Sig Dispense Refill   albuterol (VENTOLIN HFA) 108 (90 Base) MCG/ACT inhaler Inhale 1-2 puffs into the lungs every 6 (six) hours as needed for wheezing or shortness of breath.     aspirin 81 MG chewable tablet Chew 1 tablet (81 mg total) by mouth daily. 30 tablet 12   atorvastatin (LIPITOR) 80 MG tablet TAKE 1 TABLET BY MOUTH EVERY DAY AT 6PM 90 tablet 2   empagliflozin (JARDIANCE) 10 MG TABS tablet Take 1 tablet (10 mg total) by mouth daily before breakfast. 90 tablet 3   metoprolol succinate (TOPROL-XL) 50 MG 24 hr tablet TAKE 1 TABLET BY MOUTH EVERY DAY 90 tablet 3   nitroGLYCERIN (NITROSTAT) 0.4 MG SL tablet PLACE 1 TABLET UNDER THE TONGUE EVERY 5 MINUTES FOIR 3 DOSES AS NEEDED 25 tablet 11   sacubitril-valsartan (ENTRESTO) 49-51 MG Take 1 tablet by mouth 2 (two) times daily. 180 tablet 1   spironolactone  (ALDACTONE) 25 MG tablet TAKE 1/2  TABLET BY MOUTH EVERY DAY 45 tablet 0   ticagrelor (BRILINTA) 90 MG TABS tablet Take 1 tablet (90 mg total) by mouth 2 (two) times daily. 180 tablet 1   No facility-administered medications prior to visit.     Allergies:   Patient has no known allergies.   Social History   Socioeconomic History   Marital status: Married    Spouse name: Not on file   Number of children: Not on file   Years of education: Not on file   Highest education level: Not on file  Occupational History   Not on file  Tobacco Use   Smoking status: Every Day   Smokeless tobacco: Never  Substance and Sexual Activity   Alcohol use: Yes   Drug use: Never   Sexual activity: Not on file  Other Topics Concern   Not on file  Social History Narrative   Not on file   Social Determinants of Health   Financial Resource Strain: Not on file  Food Insecurity: Not on file  Transportation Needs: Not on file  Physical Activity: Not on file  Stress: Not on file  Social Connections: Unknown (10/28/2022)   [facility]   Social Network    Social Network: Not on file    Socially he was born in the [place].  He moved to [place] approximately 10 years ago.  Over the last several years due to his job he moved to the [place] area.  He currently works as a Designer, television/film set in a factory and typically walks 3 miles per day at work.  Family History: Family history is negative for heart disease  ROS General: Negative; No fevers, chills, or night sweats;  HEENT: Negative; No changes in vision or hearing, sinus congestion, difficulty swallowing Pulmonary: Negative; No cough, wheezing, shortness of breath, hemoptysis Cardiovascular: Negative; No chest pain, presyncope, syncope, palpitations GI: Negative; No nausea, vomiting, diarrhea, or abdominal pain GU: Negative; No dysuria, hematuria, or difficulty voiding Musculoskeletal: Negative; no myalgias, joint pain, or  weakness Hematologic/Oncology: Negative; no easy bruising, bleeding Endocrine: Negative; no heat/cold intolerance; no diabetes Neuro: Negative; no changes in balance, headaches Skin: Negative; No rashes or skin lesions Psychiatric: Negative; No behavioral problems, depression Sleep: Negative; No snoring, daytime sleepiness, hypersomnolence, bruxism, restless legs, hypnogognic hallucinations, no cataplexy Other comprehensive 14 point system review is negative.   PHYSICAL EXAM:   VS:  BP 132/88   Pulse 76   Ht 5\' 7"  (1.702 m)   Wt 170 lb 9.6 oz (77.4 kg)   SpO2 99%   BMI 26.72 kg/m     Repeat blood pressure by me was 122/70  Wt Readings from Last 3 Encounters:  02/15/23 170 lb 9.6 oz (77.4 kg)  08/16/22 179 lb (81.2 kg)  04/07/22 209 lb 9.6 oz (95.1 kg)    General: Alert, oriented, no distress.  Skin: normal turgor, no rashes, warm and dry HEENT: Normocephalic, atraumatic. Pupils equal round and reactive to light; sclera anicteric; extraocular muscles intact;  Nose without nasal septal hypertrophy Mouth/Parynx benign; Mallinpatti scale 3 Neck: No JVD, no carotid bruits; normal carotid upstroke Lungs: clear to ausculatation and percussion; no wheezing or rales Chest wall: without tenderness to palpitation Heart: PMI not displaced, RRR, s1 s2 normal, 1/6 systolic murmur, no diastolic murmur, no rubs, gallops, thrills, or heaves Abdomen: soft, nontender; no hepatosplenomehaly, BS+; abdominal aorta nontender and not dilated by palpation. Back: no CVA tenderness Pulses 2+ Musculoskeletal: full range of motion, normal strength, no  joint deformities Extremities: no clubbing cyanosis or edema, Homan's sign negative  Neurologic: grossly nonfocal; Cranial nerves grossly wnl Psychologic: Normal mood and affect    Studies/Labs Reviewed:    EKG Interpretation Date/Time:  Tuesday February 15 2023 13:50:07 EDT Ventricular Rate:  76 PR Interval:  172 QRS Duration:  88 QT  Interval:  368 QTC Calculation: 414 R Axis:   84  Text Interpretation: Normal sinus rhythm Right atrial enlargement Septal infarct (cited on or before 10-Feb-2019) When compared with ECG of 11-Feb-2019 06:34, Questionable change in initial forces of Anterior leads Non-specific change in ST segment in Anterolateral leads Confirmed by Nicki Guadalajara ([id number]) on 02/15/2023 2:26:15 PM    March 25, 2024ECG (independently read by me): NSR at 67, LAE, QS V1-3  April 07, 2022 ECG (independently read by me): NSR at 78, PRWP  Oct 12, 2021 ECG (independently read by me): NSR at 79, QS V1-V3  January 12, 2022 ECG (independently read by me):  NSR at 79, QS V1-2, no ectopy    July 14, 2020 ECG (independently read by me): NSR at 75, QS V1-3, no ectopy  December 21, 2019 ECG (independently read by me): Normal sinus rhythm at 75 bpm.  QS complex V1 through V4.  No ectopy.  Normal intervals.  May 2021  ECG (independently read by me): Normal sinus rhythm at 77 bpm.  Probable biatrial enlargement.  QS complex V1 through V4 consistent with his anterior wall myocardial infarction.  Normal intervals.  Recent Labs:    Latest Ref Rng & Units 10/16/2021    8:09 AM 07/10/2020    8:07 AM 10/05/2019    3:23 PM  BMP  Glucose 70 - 99 mg/dL 92  89  82   BUN 6 - 24 mg/dL 18  20  25    Creatinine 0.76 - 1.27 mg/dL 3.66  4.40  3.47   BUN/Creat Ratio 9 - 20 16  19  21    Sodium 134 - 144 mmol/L 139  138  141   Potassium 3.5 - 5.2 mmol/L 4.5  4.9  4.5   Chloride 96 - 106 mmol/L 104  101  99   CO2 20 - 29 mmol/L 22  21  23    Calcium 8.7 - 10.2 mg/dL 9.4  9.5  9.8         Latest Ref Rng & Units 10/16/2021    8:09 AM 07/10/2020    8:07 AM 07/18/2019    9:27 AM  Hepatic Function  Total Protein 6.0 - 8.5 g/dL 7.2  7.0  6.8   Albumin 3.8 - 4.9 g/dL 4.8  4.4  3.9   AST 0 - 40 IU/L 25  29  20    ALT 0 - 44 IU/L 30  28  20    Alk Phosphatase 44 - 121 IU/L 77  70  84   Total Bilirubin 0.0 - 1.2 mg/dL 0.4  0.4  0.2         Latest Ref Rng & Units 10/16/2021    8:09 AM 07/10/2020    8:07 AM 02/11/2019    2:37 AM  CBC  WBC 3.4 - 10.8 x10E3/uL 7.8  6.2  9.7   Hemoglobin 13.0 - 17.7 g/dL 42.5  95.6  38.7   Hematocrit 37.5 - 51.0 % 39.7  39.6  40.6   Platelets 150 - 450 x10E3/uL 190  247  194    Lab Results  Component Value Date   MCV 96 10/16/2021   MCV 96 07/10/2020  MCV 97.1 02/11/2019   Lab Results  Component Value Date   TSH 1.750 10/16/2021   No results found for: "HGBA1C"   BNP No results found for: "BNP"  ProBNP    Component Value Date/Time   PROBNP 117 10/16/2021 0809     Lipid Panel     Component Value Date/Time   CHOL 118 10/16/2021 0809   TRIG 62 10/16/2021 0809   HDL 65 10/16/2021 0809   CHOLHDL 1.8 10/16/2021 0809   CHOLHDL 3.8 02/10/2019 0358   VLDL 63 (H) 02/10/2019 0358   LDLCALC 40 10/16/2021 0809   LABVLDL 13 10/16/2021 0809     RADIOLOGY: No results found.   Additional studies/ records that were reviewed today include:  I reviewed the patient's hospitalization records from September 2020.  I have reviewed catheterization findings, echo Doppler study, subsequent evaluation with Azalee Course, PA, and most recent echo Doppler evaluation from June 21, 2019.  ECHO: 10/09/2020 IMPRESSIONS   1. Left ventricular ejection fraction, by estimation, is 50 to 55%. The  left ventricle has low normal function. The left ventricle has no regional  wall motion abnormalities. The left ventricular internal cavity size was  mildly dilated. Left ventricular  diastolic parameters were normal.   2. Right ventricular systolic function is normal. The right ventricular  size is normal. There is normal pulmonary artery systolic pressure.   3. The mitral valve is normal in structure. Trivial mitral valve  regurgitation.   4. The aortic valve is tricuspid. Aortic valve regurgitation is trivial.  Mild aortic valve sclerosis is present, with no evidence of aortic valve  stenosis.   5.  Aortic dilatation noted. There is mild dilatation of the ascending  aorta, measuring 41 mm.   6. The inferior vena cava is normal in size with greater than 50%  respiratory variability, suggesting right atrial pressure of 3 mmHg.   Comparison(s): Compared to prior TTE in 05/2019, the LVEF has  significantly improved to 50-55% with improved wall motion. Ascending  aorta now measures 41mm (previously 38mm).    ECHO: 03/24/2022 IMPRESSIONS   1. Left ventricular ejection fraction, by estimation, is 40 to 45%. The  left ventricle has mildly decreased function. The left ventricle  demonstrates regional wall motion abnormalities (see scoring  diagram/findings for description). Left ventricular  diastolic parameters were normal.   2. Right ventricular systolic function is normal. The right ventricular  size is normal. There is normal pulmonary artery systolic pressure. The  estimated right ventricular systolic pressure is 30.0 mmHg.   3. The mitral valve is grossly normal. Trivial mitral valve  regurgitation. No evidence of mitral stenosis.   4. The aortic valve is tricuspid. Aortic valve regurgitation is not  visualized. No aortic stenosis is present.   5. There is mild dilatation of the ascending aorta, measuring 41 mm.   6. The inferior vena cava is normal in size with greater than 50%  respiratory variability, suggesting right atrial pressure of 3 mmHg.   Comparison(s): Changes from prior study are noted. EF 40-45%. Septal  hypokinesis more apparent on contrast images.   Conclusion(s)/Recommendation(s): Findings consistent with ischemic  cardiomyopathy.    ASSESSMENT:    1. Acute ST elevation myocardial infarction (STEMI) due to occlusion of proximal portion of left anterior descending (LAD) coronary artery [facility]): February 10, 2019   2. Ischemic cardiomyopathy   3. Primary hypertension   4. Hyperlipidemia with target LDL less than 70   5. Tobacco abuse     PLAN:  Mr. Tasman Edelman is a 62 year old gentleman originally from the [place] who denied any prior cardiac history but has a long history of tobacco use.  He presented with an acute coronary syndrome on February 10, 2019 and was found to have total proximal occlusion of a large LAD which was successfully stented.  In January 2021 and despite being on losartan 25 mg and Toprol-XL 25 mg an echo Doppler study continued to show reduced LV function with EF at 30 to 35% with grade 1 diastolic dysfunction.  There was mild dilation of his left ventricular.  He has successfully been transitioned to [facility] and remains euvolemic on 49/51 mg twice daily in addition to metoprolol succinate 25 mg and spironolactone 12.5 mg daily A proBNP on July 10, 2020 showed a normal level at 166.  His echo Doppler study from 01/09/2021 demonstrated improvement in LV function to an EF of 50 to 55%.    The LV remained mildly dilated.  There was mild aortic sclerosis without stenosis.  His ascending aorta was mildly dilated at 41 mm.  At his evaluation in August 2022 I recommended further titration of metoprolol succinate to 50 mg daily with resting heart rate in the 70s. His most recent echo Doppler study from March 24, 2022 showed an EF now at 40 to 45% with wall motion abnormalities secondary to his prior LAD infarction consistent with ischemic cardiomyopathy.  At his November 2023 evaluation Jardiance 10 mg was added to his Entresto 49/51 mg twice a day, metoprolol succinate 50 mg and spironolactone 12.5 mg daily.  He continues to be on DAPT with aspirin/Brilinta.  The future dose of Brilinta can be reduced to 60 mg twice a day.  He continues to be on atorvastatin 80 mg for his hyperlipidemia.  Has been doing exceptionally well.  He has lost a total of 40 pounds.  He is now walking 4 miles per day.  There is no PND orthopnea.  Blood pressure is stable.  He will be going to [place] this weekend for his birthday which is on  September [birth year].  Upon his return I have recommended follow-up laboratory with a fasting comprehensive metabolic panel, CBC, TSH and lipid studies.  I have recommended he undergo a follow-up echo Doppler study in February/March 2025 and I will see him for follow-up evaluation in March/April. 2025.    Medication Adjustments/Labs and Tests Ordered: Current medicines are reviewed at length with the patient today.  Concerns regarding medicines are outlined above.  Medication changes, Labs and Tests ordered today are listed in the Patient Instructions below. Patient Instructions  Medication Instructions:  NO MEDICATION CHANGES *If you need a refill on your cardiac medications before your next appointment, please call your pharmacy*   Lab Work: Return for fasting labs .........Marland KitchenCMET, CBC, LIPIDS, TSH If you have labs (blood work) drawn today and your tests are completely normal, you will receive your results only by: MyChart Message (if you have MyChart) OR A paper copy in the mail If you have any lab test that is abnormal or we need to change your treatment, we will call you to review the results.   Testing/Procedures: Your physician has requested that you have an echocardiogram IN [facility] 2025. Echocardiography is a painless test that uses sound waves to create images of your heart. It provides your doctor with information about the size and shape of your heart and how well your heart's chambers and valves are working. This procedure takes approximately one  hour. There are no restrictions for this procedure. Please do NOT wear cologne, perfume, aftershave, or lotions (deodorant is allowed). Please arrive 15 minutes prior to your appointment time.    Follow-Up: At [facility], you and your health needs are our priority.  As part of our continuing mission to provide you with exceptional heart care, we have created designated Provider Care Teams.  These Care Teams include your primary  Cardiologist (physician) and Advanced Practice Providers (APPs -  Physician Assistants and Nurse Practitioners) who all work together to provide you with the care you need, when you need it.  We recommend signing up for the patient portal called "MyChart".  Sign up information is provided on this After Visit Summary.  MyChart is used to connect with patients for Virtual Visits (Telemedicine).  Patients are able to view lab/test results, encounter notes, upcoming appointments, etc.  Non-urgent messages can be sent to your provider as well.   To learn more about what you can do with MyChart, go to [email or web address].    Your next appointment:   6 month(s)  Provider:   Nicki Guadalajara, MD        Signed, Nicki Guadalajara, MD  02/15/2023 5:59 PM    [facility] [facility] [address], [place], [place]  [address] Phone: [phone]

## 2023-02-15 NOTE — Patient Instructions (Signed)
Medication Instructions:  NO MEDICATION CHANGES *If you need a refill on your cardiac medications before your next appointment, please call your pharmacy*   Lab Work: Return for fasting labs .........Marland KitchenCMET, CBC, LIPIDS, TSH If you have labs (blood work) drawn today and your tests are completely normal, you will receive your results only by: MyChart Message (if you have MyChart) OR A paper copy in the mail If you have any lab test that is abnormal or we need to change your treatment, we will call you to review the results.   Testing/Procedures: Your physician has requested that you have an echocardiogram IN [facility] 2025. Echocardiography is a painless test that uses sound waves to create images of your heart. It provides your doctor with information about the size and shape of your heart and how well your heart's chambers and valves are working. This procedure takes approximately one hour. There are no restrictions for this procedure. Please do NOT wear cologne, perfume, aftershave, or lotions (deodorant is allowed). Please arrive 15 minutes prior to your appointment time.    Follow-Up: At [facility], you and your health needs are our priority.  As part of our continuing mission to provide you with exceptional heart care, we have created designated Provider Care Teams.  These Care Teams include your primary Cardiologist (physician) and Advanced Practice Providers (APPs -  Physician Assistants and Nurse Practitioners) who all work together to provide you with the care you need, when you need it.  We recommend signing up for the patient portal called "MyChart".  Sign up information is provided on this After Visit Summary.  MyChart is used to connect with patients for Virtual Visits (Telemedicine).  Patients are able to view lab/test results, encounter notes, upcoming appointments, etc.  Non-urgent messages can be sent to your provider as well.   To learn more about what you can do with  MyChart, go to [email or web address].    Your next appointment:   6 month(s)  Provider:   Nicki Guadalajara, MD

## 2023-02-26 ENCOUNTER — Other Ambulatory Visit: Payer: Self-pay | Admitting: Cardiovascular Disease

## 2023-03-11 ENCOUNTER — Other Ambulatory Visit: Payer: Self-pay | Admitting: Cardiovascular Disease

## 2023-04-05 ENCOUNTER — Other Ambulatory Visit: Payer: Self-pay | Admitting: Cardiovascular Disease

## 2023-07-05 ENCOUNTER — Other Ambulatory Visit: Payer: Self-pay | Admitting: Cardiovascular Disease

## 2023-08-05 ENCOUNTER — Ambulatory Visit (HOSPITAL_COMMUNITY): Payer: BC Managed Care – PPO | Attending: Cardiovascular Disease

## 2023-08-05 DIAGNOSIS — I361 Nonrheumatic tricuspid (valve) insufficiency: Secondary | ICD-10-CM | POA: Diagnosis not present

## 2023-08-05 DIAGNOSIS — I255 Ischemic cardiomyopathy: Secondary | ICD-10-CM | POA: Insufficient documentation

## 2023-08-05 LAB — ECHOCARDIOGRAM COMPLETE
Area-P 1/2: 3.37 cm2
S' Lateral: 4 cm

## 2023-08-10 ENCOUNTER — Other Ambulatory Visit: Payer: Self-pay | Admitting: Cardiovascular Disease

## 2023-08-12 ENCOUNTER — Ambulatory Visit: Payer: BC Managed Care – PPO | Admitting: Cardiovascular Disease

## 2023-08-30 ENCOUNTER — Other Ambulatory Visit: Payer: Self-pay | Admitting: Cardiovascular Disease

## 2023-10-22 ENCOUNTER — Other Ambulatory Visit: Payer: Self-pay | Admitting: Cardiovascular Disease

## 2023-11-01 ENCOUNTER — Ambulatory Visit: Attending: Cardiovascular Disease | Admitting: Cardiovascular Disease

## 2023-11-01 ENCOUNTER — Encounter: Payer: Self-pay | Admitting: Cardiovascular Disease

## 2023-11-01 ENCOUNTER — Other Ambulatory Visit: Payer: Self-pay | Admitting: *Deleted

## 2023-11-01 VITALS — BP 128/86 | HR 75 | Ht 67.0 in | Wt 170.2 lb

## 2023-11-01 DIAGNOSIS — I10 Essential (primary) hypertension: Secondary | ICD-10-CM

## 2023-11-01 DIAGNOSIS — E785 Hyperlipidemia, unspecified: Secondary | ICD-10-CM | POA: Diagnosis not present

## 2023-11-01 DIAGNOSIS — I2102 ST elevation (STEMI) myocardial infarction involving left anterior descending coronary artery: Secondary | ICD-10-CM

## 2023-11-01 DIAGNOSIS — I255 Ischemic cardiomyopathy: Secondary | ICD-10-CM | POA: Diagnosis not present

## 2023-11-01 DIAGNOSIS — I5020 Unspecified systolic (congestive) heart failure: Secondary | ICD-10-CM

## 2023-11-01 DIAGNOSIS — E782 Mixed hyperlipidemia: Secondary | ICD-10-CM

## 2023-11-01 DIAGNOSIS — Z720 Tobacco use: Secondary | ICD-10-CM

## 2023-11-01 NOTE — Patient Instructions (Signed)
 Medication Instructions:  Your physician recommends that you continue on your current medications as directed. Please refer to the Current Medication list given to you today.  *If you need a refill on your cardiac medications before your next appointment, please call your pharmacy*  Lab Work: TOMORROW, COME FASTING, TO [facility], FOR:  CMET, CBC, TSH, LIPID, & LPa  If you have labs (blood work) drawn today and your tests are completely normal, you will receive your results only by: MyChart Message (if you have MyChart) OR A paper copy in the mail If you have any lab test that is abnormal or we need to change your treatment, we will call you to review the results.  Testing/Procedures: None ordered  Follow-Up: At [facility], you and your health needs are our priority.  As part of our continuing mission to provide you with exceptional heart care, our providers are all part of one team.  This team includes your primary Cardiologist (physician) and Advanced Practice Providers or APPs (Physician Assistants and Nurse Practitioners) who all work together to provide you with the care you need, when you need it.  Your next appointment:   8 month(s)  Provider:   Randene Bustard, MD    We recommend signing up for the patient portal called "MyChart".  Sign up information is provided on this After Visit Summary.  MyChart is used to connect with patients for Virtual Visits (Telemedicine).  Patients are able to view lab/test results, encounter notes, upcoming appointments, etc.  Non-urgent messages can be sent to your provider as well.   To learn more about what you can do with MyChart, go to [email or web address].   Other Instructions

## 2023-11-01 NOTE — Progress Notes (Signed)
 Cardiology Office Note    Date:  11/01/2023   ID:  Cory Berry, DOB 05/11/1961, MRN [id number]  PCP:  Pcp, No  Cardiologist:  Magnus Schuller, MD   9 month F/u  office visit   History of Present Illness:  Cory Berry is a 63 y.o. male who presents for a 6 month follow-up cardiology evaluation   Cory Berry is originally from the [place] and has been living in the [place]  for approximately 13 years.  On February 10, 2019 he developed new onset chest pain, and ECG showed ST elevation in V5 and V6 and a code STEMI was activated.  He underwent emergent cardiac catheterization and intervention by me and was found to have total occlusion of the proximal LAD.  There was 40% first marginal stenosis and he had a dominant RCA with luminal regularity.  He underwent successful emergent PCI to his LAD with ultimate insertion of a 3.0 x 32 mm Synergy DES stent postdilated to 3.3-3.25 taper with stenosis improved to 0% and resumption of TIMI-3 flow.  An in-hospital echo Doppler study on February 10, 2019 showed EF of 35 to 40%.   He was subsequently evaluated in the office on February 23, 2019 by Ervin Heath, [place].  At that time he was stable without recurrent chest pain.  He was on aspirin /Brilinta  for DAPT.  Losartan  25 mg was added to his regimen of Toprol -XL 25 mg.  He has tolerated that well.  He has continued to be on atorvastatin  80 mg.  He was referred for follow-up echo Doppler study which was recently completed on June 21, 1999.  EF remains low at 30 to 35% and there was grade 1 diastolic dysfunction.  There were regional wall motion abnormalities.  I saw him for his initial evaluation with me in the office on July 05, 2019.  At that time he denied any recurrent chest pain PND orthopnea or palpitations.  Historically, he had started smoking as a teenager and after smoking for many years had quit smoking for over 23 years.  Unfortunately he had restarted smoking 10 years ago.  He had  been smoking up to 2 packs/day and since his myocardial infarction he has significantly reduced his smoking to less than 1 pack/day for continues to smoke.  When his initial evaluation, with his reduced LV function I recommended transition to Entresto  and provided him with samples of 24/26 mg twice a day 2 weeks and and recommended he discontinue losartan .  I recommended follow-up with our pharmacy clinic.  When I saw him in follow-up in May 2021 he was on Entresto  49/51 mg twice a day, Toprol -XL 25 mg in addition to aspirin  and Brilinta  and atorvastatin  80 mg.  He has felt well.  He denies chest pain or shortness of breath.  He wasreducing his cigarettes but unfortunately is still smoking.  He tells me he is contemplating a possible job promotion which may require him returning back to [place].  I saw him on December 21, 2019 and since his prior evaluation he turned down the job change to return to the [place].  He  continued to be asymptomatic without any heart failure symptoms and continues to be on Entresto  49/51 mg twice a day, metoprolol  succinate 25 mg daily, spironolactone  12.5 mg.  He continued to be on DAPT with aspirin /ticagrelor  90 mg twice a day.  He felt well and denied any shortness of breath, PND or orthopnea.  Laboratory in May showed a proBNP that was normal at 172.  Creatinine had improved to 1.17.  Potassium was 4.5.  His insurance at work to change and there has been some confusion with reference to his deductible.  He was told that it would now cost him $500 per month.  He has reduced tobacco but unfortunately is still smoking some.  During that evaluation, we were able to provide him with the information so that he would have a new card with his new insurance to significantly reduce his deductible for Entresto .  He continued to be on atorvastatin  80 mg for hyperlipidemia.  I saw him on July 14, 2020 at which time he remained asymptomatic.  He denied  any shortness of  breath or chest pain or palpitations.  He continues to be on aspirin /Brilinta  without bleeding.  He was on Entresto  49/51 mg twice a day, metoprolol  succinate 25 mg and spironolactone  12.5 mg daily.  He continued to be on atorvastatin  80 mg.  He was planning to go to the [place] for family wedding in June.  I had recommended over the next several months that he had a repeat echo Doppler study for reassessment of LV size and function.  He underwent an echo Doppler study on Oct 09, 2020 which showed improvement in LV function now at 50 to 55% ejection fraction.  The left ventricular internal cavity remains mildly dilated.  He had normal diastolic parameters.  There was mild aortic sclerosis without stenosis.  There was mild dilation of his ascending aorta at 41 mm.  I saw him in January 12, 2021 at which time he continued to be stable.  Cory Berry went to the [place] in June and ended up testing positive for COVID.  He had mild symptoms and had been vaccinated.  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  He denies any chest pain PND orthopnea.  He denies any significant shortness of breath.  During that evaluation, his resting pulse was in the upper 70s to 80s and I suggested slight additional titration of metoprolol  succinate from 25 mg daily up to 37.5 mg daily and after 2 weeks depending upon heart rate increased to 50 mg.  I again discussed the importance of complete smoking cessation.  LDL cholesterol in February 2022 was excellent at 37 on atorvastatin  80 mg.  I saw him on Oct 12, 2021 at which time he continued to be stable and denied any chest pain or shortness of breath.  He continues to be on aspirin /Brilinta  for DAPT and was on Entresto  49/51 mg twice a day, spironolactone  12.5 mg and metoprolol  succinate at 50 mg.  He continued to be active.  He has significantly reduced his tobacco use but still smokes occasional  cigarettes.  He denied presyncope or syncope, PND orthopnea.  He was euvolemic on exam.  I recommended fasting laboratory and reassessment of LV function in 6 months.  He underwent his subsequent echo Doppler study on March 24, 2022.  This now shows EF estimated 40 to 45% with wall motion normality involving the mid distal anterior septum, mid inferoseptal segment and apex.  Left ventricular diastolic parameters were normal.  RV systolic function was normal.  Valves were essentially normal.  There was mild dilation of ascending aorta 41 mm.  I saw him on April 07, 2022.  Since his prior evaluation he had where he caught a cold  for 3 weeks.  Ultimately this resolved.  He has been walking approximately 3 miles per day.  His heart rate typically is in the 70s.  He denies chest pain or shortness of breath.  He continues to be on aspirin  and Brilinta .  He is on Entresto  49/51 twice a day, metoprolol  succinate 50 mg daily and spironolactone  25 mg.  He is unaware of palpitations presyncope or syncope.  During that evaluation, I initiated Jardiance  10 mg daily.  I again discussed the importance of complete smoking cessation.  I saw him on August 16, 2022.  Since his prior evaluation  he has had purposeful weight loss from 209 pounds down to 179 pounds.  He feels much better with initiation of Jardiance  10 mg and continues to be on Entresto  49/51 mg twice a day, metoprolol  succinate 50 mg, spironolactone  12.5 mg daily.  He continues to be on DAPT with aspirin /Brilinta .  He denies any chest pain or shortness of breath.  He has been very busy doing home repairs into rooms at home.  Previously had built a house in [place].    I last saw him on February 15, 2023;  Cory Berry has continued to do well.  He spent 5 weeks in [place] and did visit his parents in [place] summer.  He has remained very active fixing several rooms in the house addition.  He has continued to be effective with weight loss and has lost an  additional 10 pounds since his last evaluation.  He is now walking at least 4 miles per day without difficulty.  Smokes an occasional cigarette.  He denies chest tightness PND orthopnea palpitations, presyncope or syncope.  He has not had recent laboratory.    Since I last saw him, Cory Berry tells me he had forgotten to have his laboratory drawn.  He remains active.  He is contemplating possibly an early retirement.  He denies any chest pain or decline in exercise tolerance.  He remains active and has fixed several rooms in a house addition.  He continues to walk at times up to 4 miles per day without difficulty.  Unfortunately, he has not completely discontinue tobacco use.  He presents for evaluation   Past Medical History:  Diagnosis Date   Asthma     Past Surgical History:  Procedure Laterality Date   CORONARY STENT INTERVENTION N/A 02/10/2019   Procedure: CORONARY STENT INTERVENTION;  Surgeon: Millicent Ally, MD;  Location: [facility];  Service: Cardiovascular;  Laterality: N/A;   CORONARY/GRAFT ACUTE MI REVASCULARIZATION N/A 02/10/2019   Procedure: Coronary/Graft Acute MI Revascularization;  Surgeon: Millicent Ally, MD;  Location: [facility];  Service: Cardiovascular;  Laterality: N/A;   LEFT HEART CATH AND CORONARY ANGIOGRAPHY N/A 02/10/2019   Procedure: LEFT HEART CATH AND CORONARY ANGIOGRAPHY;  Surgeon: Millicent Ally, MD;  Location: [facility];  Service: Cardiovascular;  Laterality: N/A;   NECK SURGERY      Current Medications: Outpatient Medications Prior to Visit  Medication Sig Dispense Refill   albuterol  (VENTOLIN  HFA) 108 (90 Base) MCG/ACT inhaler Inhale 1-2 puffs into the lungs every 6 (six) hours as needed for wheezing or shortness of breath.     aspirin  81 MG chewable tablet Chew 1 tablet (81 mg total) by mouth daily. 30 tablet 12   atorvastatin  (LIPITOR ) 80 MG tablet TAKE 1 TABLET BY MOUTH EVERY DAY AT 6PM 90 tablet 0   BRILINTA  90 MG TABS tablet  TAKE 1  TABLET BY MOUTH 2 TIMES DAILY. 180 tablet 1   empagliflozin  (JARDIANCE ) 10 MG TABS tablet TAKE 1 TABLET BY MOUTH DAILY BEFORE BREAKFAST. 90 tablet 3   ENTRESTO  49-51 MG TAKE 1 TABLET BY MOUTH TWICE A DAY 180 tablet 1   metoprolol  succinate (TOPROL -XL) 50 MG 24 hr tablet TAKE 1 TABLET BY MOUTH EVERY DAY 90 tablet 3   nitroGLYCERIN  (NITROSTAT ) 0.4 MG SL tablet PLACE 1 TABLET UNDER THE TONGUE EVERY 5 MINUTES FOIR 3 DOSES AS NEEDED 25 tablet 11   spironolactone  (ALDACTONE ) 25 MG tablet TAKE 1/2 TABLET BY MOUTH EVERY DAY 45 tablet 6   No facility-administered medications prior to visit.     Allergies:   Patient has no known allergies.   Social History   Socioeconomic History   Marital status: Married    Spouse name: Not on file   Number of children: Not on file   Years of education: Not on file   Highest education level: Not on file  Occupational History   Not on file  Tobacco Use   Smoking status: Every Day   Smokeless tobacco: Never   Tobacco comments:    1 PPD  Substance and Sexual Activity   Alcohol use: Yes   Drug use: Never   Sexual activity: Not on file  Other Topics Concern   Not on file  Social History Narrative   Not on file   Social Drivers of Health   Financial Resource Strain: Not on file  Food Insecurity: Not on file  Transportation Needs: Not on file  Physical Activity: Not on file  Stress: Not on file  Social Connections: Unknown (10/28/2022)   Received from [facility]   Social Network    Social Network: Not on file    Socially he was born in the [place].  He moved to [place]  approximately 10 years ago.  Over the last several years due to his job he moved to the [place] area.  He currently works as a Designer, television/film set in a factory and typically walks 3 miles per day at work.  Family History: Family history is negative for heart disease  ROS General: Negative; No fevers, chills, or night sweats;  HEENT: Negative; No changes in vision  or hearing, sinus congestion, difficulty swallowing Pulmonary: Negative; No cough, wheezing, shortness of breath, hemoptysis Cardiovascular: Negative; No chest pain, presyncope, syncope, palpitations GI: Negative; No nausea, vomiting, diarrhea, or abdominal pain GU: Negative; No dysuria, hematuria, or difficulty voiding Musculoskeletal: Negative; no myalgias, joint pain, or weakness Hematologic/Oncology: Negative; no easy bruising, bleeding Endocrine: Negative; no heat/cold intolerance; no diabetes Neuro: Negative; no changes in balance, headaches Skin: Negative; No rashes or skin lesions Psychiatric: Negative; No behavioral problems, depression Sleep: Negative; No snoring, daytime sleepiness, hypersomnolence, bruxism, restless legs, hypnogognic hallucinations, no cataplexy Other comprehensive 14 point system review is negative.   PHYSICAL EXAM:   VS:  BP 128/86   Pulse 75   Ht 5\' 7"  (1.702 m)   Wt 170 lb 3.2 oz (77.2 kg)   SpO2 96%   BMI 26.66 kg/m     Repeat blood pressure by me was 128/80  Wt Readings from Last 3 Encounters:  11/01/23 170 lb 3.2 oz (77.2 kg)  02/15/23 170 lb 9.6 oz (77.4 kg)  08/16/22 179 lb (81.2 kg)   General: Alert, oriented, no distress.  Skin: normal turgor, no rashes, warm and dry HEENT: Normocephalic, atraumatic. Pupils equal round and reactive to light; sclera anicteric; extraocular muscles intact;  Nose without nasal septal hypertrophy Mouth/Parynx benign; Mallinpatti scale 3 Neck: No JVD, no carotid bruits; normal carotid upstroke Lungs: clear to ausculatation and percussion; no wheezing or rales Chest wall: without tenderness to palpitation Heart: PMI not displaced, RRR, s1 s2 normal, 1/6 systolic murmur, no diastolic murmur, no rubs, gallops, thrills, or heaves Abdomen: soft, nontender; no hepatosplenomehaly, BS+; abdominal aorta nontender and not dilated by palpation. Back: no CVA tenderness Pulses 2+ Musculoskeletal: full range of motion,  normal strength, no joint deformities Extremities: no clubbing cyanosis or edema, Homan's sign negative  Neurologic: grossly nonfocal; Cranial nerves grossly wnl Psychologic: Normal mood and affect     Studies/Labs Reviewed:    EKG Interpretation Date/Time:  Tuesday November 01 2023 13:51:33 EDT Ventricular Rate:  75 PR Interval:  182 QRS Duration:  76 QT Interval:  356 QTC Calculation: 397 R Axis:   62  Text Interpretation: Normal sinus rhythm Low voltage QRS Septal infarct (cited on or before 10-Feb-2019) When compared with ECG of 15-Feb-2023 13:50, Questionable change in initial forces of Septal leads Confirmed by Magnus Schuller ([id number]) on 11/01/2023 4:18:39 PM    February 15, 2023 ECG (independently read by me): Normal sinus rhythm, right atrial enlargement.  August 16, 2022 ECG (independently read by me): NSR at 67, LAE, QS V1-3  April 07, 2022 ECG (independently read by me): NSR at 78, PRWP  Oct 12, 2021 ECG (independently read by me): NSR at 79, QS V1-V3  January 12, 2022 ECG (independently read by me):  NSR at 79, QS V1-2, no ectopy    July 14, 2020 ECG (independently read by me): NSR at 75, QS V1-3, no ectopy  December 21, 2019 ECG (independently read by me): Normal sinus rhythm at 75 bpm.  QS complex V1 through V4.  No ectopy.  Normal intervals.  May 2021  ECG (independently read by me): Normal sinus rhythm at 77 bpm.  Probable biatrial enlargement.  QS complex V1 through V4 consistent with his anterior wall myocardial infarction.  Normal intervals.  Recent Labs:    Latest Ref Rng & Units 10/16/2021    8:09 AM 07/10/2020    8:07 AM 10/05/2019    3:23 PM  BMP  Glucose 70 - 99 mg/dL 92  89  82   BUN 6 - 24 mg/dL 18  20  25    Creatinine 0.76 - 1.27 mg/dL 1.91  4.78  2.95   BUN/Creat Ratio 9 - 20 16  19  21    Sodium 134 - 144 mmol/L 139  138  141   Potassium 3.5 - 5.2 mmol/L 4.5  4.9  4.5   Chloride 96 - 106 mmol/L 104  101  99   CO2 20 - 29 mmol/L 22  21  23     Calcium  8.7 - 10.2 mg/dL 9.4  9.5  9.8         Latest Ref Rng & Units 10/16/2021    8:09 AM 07/10/2020    8:07 AM 07/18/2019    9:27 AM  Hepatic Function  Total Protein 6.0 - 8.5 g/dL 7.2  7.0  6.8   Albumin 3.8 - 4.9 g/dL 4.8  4.4  3.9   AST 0 - 40 IU/L 25  29  20    ALT 0 - 44 IU/L 30  28  20    Alk Phosphatase 44 - 121 IU/L 77  70  84   Total Bilirubin 0.0 - 1.2 mg/dL 0.4  0.4  0.2  Latest Ref Rng & Units 10/16/2021    8:09 AM 07/10/2020    8:07 AM 02/11/2019    2:37 AM  CBC  WBC 3.4 - 10.8 x10E3/uL 7.8  6.2  9.7   Hemoglobin 13.0 - 17.7 g/dL 16.1  09.6  04.5   Hematocrit 37.5 - 51.0 % 39.7  39.6  40.6   Platelets 150 - 450 x10E3/uL 190  247  194    Lab Results  Component Value Date   MCV 96 10/16/2021   MCV 96 07/10/2020   MCV 97.1 02/11/2019   Lab Results  Component Value Date   TSH 1.750 10/16/2021   No results found for: "HGBA1C"   BNP No results found for: "BNP"  ProBNP    Component Value Date/Time   PROBNP 117 10/16/2021 0809     Lipid Panel     Component Value Date/Time   CHOL 118 10/16/2021 0809   TRIG 62 10/16/2021 0809   HDL 65 10/16/2021 0809   CHOLHDL 1.8 10/16/2021 0809   CHOLHDL 3.8 02/10/2019 0358   VLDL 63 (H) 02/10/2019 0358   LDLCALC 40 10/16/2021 0809   LABVLDL 13 10/16/2021 0809     RADIOLOGY: No results found.   Additional studies/ records that were reviewed today include:  I reviewed the patient's hospitalization records from September 2020.  I have reviewed catheterization findings, echo Doppler study, subsequent evaluation with Ervin Heath, PA, and most recent echo Doppler evaluation from June 21, 2019.  ECHO: 10/09/2020 IMPRESSIONS   1. Left ventricular ejection fraction, by estimation, is 50 to 55%. The  left ventricle has low normal function. The left ventricle has no regional  wall motion abnormalities. The left ventricular internal cavity size was  mildly dilated. Left ventricular  diastolic parameters were  normal.   2. Right ventricular systolic function is normal. The right ventricular  size is normal. There is normal pulmonary artery systolic pressure.   3. The mitral valve is normal in structure. Trivial mitral valve  regurgitation.   4. The aortic valve is tricuspid. Aortic valve regurgitation is trivial.  Mild aortic valve sclerosis is present, with no evidence of aortic valve  stenosis.   5. Aortic dilatation noted. There is mild dilatation of the ascending  aorta, measuring 41 mm.   6. The inferior vena cava is normal in size with greater than 50%  respiratory variability, suggesting right atrial pressure of 3 mmHg.   Comparison(s): Compared to prior TTE in 05/2019, the LVEF has  significantly improved to 50-55% with improved wall motion. Ascending  aorta now measures 41mm (previously 38mm).    ECHO: 03/24/2022 IMPRESSIONS   1. Left ventricular ejection fraction, by estimation, is 40 to 45%. The  left ventricle has mildly decreased function. The left ventricle  demonstrates regional wall motion abnormalities (see scoring  diagram/findings for description). Left ventricular  diastolic parameters were normal.   2. Right ventricular systolic function is normal. The right ventricular  size is normal. There is normal pulmonary artery systolic pressure. The  estimated right ventricular systolic pressure is 30.0 mmHg.   3. The mitral valve is grossly normal. Trivial mitral valve  regurgitation. No evidence of mitral stenosis.   4. The aortic valve is tricuspid. Aortic valve regurgitation is not  visualized. No aortic stenosis is present.   5. There is mild dilatation of the ascending aorta, measuring 41 mm.   6. The inferior vena cava is normal in size with greater than 50%  respiratory variability, suggesting right atrial  pressure of 3 mmHg.   Comparison(s): Changes from prior study are noted. EF 40-45%. Septal  hypokinesis more apparent on contrast images.    Conclusion(s)/Recommendation(s): Findings consistent with ischemic  cardiomyopathy.    ASSESSMENT:    1. ST elevation myocardial infarction involving left anterior descending (LAD) coronary artery (HCC)   2. Ischemic cardiomyopathy: Improved   3. Primary hypertension   4. Hyperlipidemia with target LDL less than 70   5. Tobacco abuse     PLAN:  Cory Berry is a 63 year old gentleman originally from the [place] who denied any prior cardiac history but has a long history of tobacco use.  He presented with an acute coronary syndrome on February 10, 2019 and was found to have total proximal occlusion of a large LAD which was successfully stented.  In January 2021 and despite being on losartan  25 mg and Toprol -XL 25 mg an echo Doppler study continued to show reduced LV function with EF at 30 to 35% with grade 1 diastolic dysfunction.  There was mild dilation of his left ventricular.  He has successfully been transitioned to Entresto  and remains euvolemic on 49/51 mg twice daily in addition to metoprolol  succinate 25 mg and spironolactone  12.5 mg daily.  A proBNP on July 10, 2020 showed a normal level at 166.  His echo Doppler study from 01/09/2021 demonstrated improvement in LV function to an EF of 50 to 55%.    The LV remained mildly dilated.  There was mild aortic sclerosis without stenosis.  His ascending aorta was mildly dilated at 41 mm.  At his evaluation in August 2022 I recommended further titration of metoprolol  succinate to 50 mg daily with resting heart rate in the 70s. His echo Doppler study from March 24, 2022 showed an EF now at 40 to 45% with wall motion abnormalities secondary to his prior LAD infarction consistent with ischemic cardiomyopathy.  At his November 2023 evaluation Jardiance  10 mg was added to his Entresto  49/51 mg twice a day, metoprolol  succinate 50 mg and spironolactone  12.5 mg daily.  He continues to be on DAPT with aspirin /Brilinta .  Presently, he  remains asymptomatic on current therapy.  I have recommended he decrease Brilinta  to 60 mg twice a day from his present dose of 90 mg twice daily per Pegasys trial data.  He underwent a follow-up echo Doppler study on August 05, 2023.  LV function has slightly improved and is now low normal with EF at 50 to 55% and normal strain pattern.  There was mild biatrial enlargement and mild aortic sclerosis without stenosis.  When I last saw him I recommended comprehensive laboratory.  Unfortunately he traveled to [place] and forgot to have it laboratory done upon his return.  I will schedule him to undergo comprehensive metabolic panel, CBC, TSH, fasting lipid panel and LP(a) to be done this week.  I will contact him regarding the results.  I discussed the absolute importance of complete discontinuance of tobacco use.  He is aware of my imminent retirement.  I will transition him to the care of Dr. Randene Bustard and arrange for initial evaluation in approximately 8 months or sooner as needed.   Medication Adjustments/Labs and Tests Ordered: Current medicines are reviewed at length with the patient today.  Concerns regarding medicines are outlined above.  Medication changes, Labs and Tests ordered today are listed in the Patient Instructions below. Patient Instructions  Medication Instructions:  Your physician recommends that you continue on your current medications as directed.  Please refer to the Current Medication list given to you today.  *If you need a refill on your cardiac medications before your next appointment, please call your pharmacy*  Lab Work: TOMORROW, COME FASTING, TO [facility], FOR:  CMET, CBC, TSH, LIPID, & LPa  If you have labs (blood work) drawn today and your tests are completely normal, you will receive your results only by: MyChart Message (if you have MyChart) OR A paper copy in the mail If you have any lab test that is abnormal or we need to change your treatment, we will call you to  review the results.  Testing/Procedures: None ordered  Follow-Up: At [facility], you and your health needs are our priority.  As part of our continuing mission to provide you with exceptional heart care, our providers are all part of one team.  This team includes your primary Cardiologist (physician) and Advanced Practice Providers or APPs (Physician Assistants and Nurse Practitioners) who all work together to provide you with the care you need, when you need it.  Your next appointment:   8 month(s)  Provider:   Randene Bustard, MD    We recommend signing up for the patient portal called "MyChart".  Sign up information is provided on this After Visit Summary.  MyChart is used to connect with patients for Virtual Visits (Telemedicine).  Patients are able to view lab/test results, encounter notes, upcoming appointments, etc.  Non-urgent messages can be sent to your provider as well.   To learn more about what you can do with MyChart, go to [email or web address].   Other Instructions        Signed, Magnus Schuller, MD  11/01/2023 4:24 PM    [facility] [address], [place], [place]  [address] Phone: [phone]

## 2023-11-04 DIAGNOSIS — E782 Mixed hyperlipidemia: Secondary | ICD-10-CM | POA: Diagnosis not present

## 2023-11-04 DIAGNOSIS — I2102 ST elevation (STEMI) myocardial infarction involving left anterior descending coronary artery: Secondary | ICD-10-CM | POA: Diagnosis not present

## 2023-11-04 DIAGNOSIS — I10 Essential (primary) hypertension: Secondary | ICD-10-CM | POA: Diagnosis not present

## 2023-11-04 DIAGNOSIS — I5020 Unspecified systolic (congestive) heart failure: Secondary | ICD-10-CM | POA: Diagnosis not present

## 2023-11-05 LAB — LIPID PANEL
Chol/HDL Ratio: 1.6 ratio (ref 0.0–5.0)
Cholesterol, Total: 112 mg/dL (ref 100–199)
HDL: 70 mg/dL (ref >39)
LDL Chol Calc (NIH): 31 mg/dL (ref 0–99)
Triglycerides: 41 mg/dL (ref 0–149)
VLDL Cholesterol Cal: 11 mg/dL (ref 5–40)

## 2023-11-05 LAB — COMPREHENSIVE METABOLIC PANEL WITH GFR
ALT: 21 IU/L (ref 0–44)
AST: 24 IU/L (ref 0–40)
Albumin: 4.6 g/dL (ref 3.9–4.9)
Alkaline Phosphatase: 80 IU/L (ref 44–121)
BUN/Creatinine Ratio: 15 (ref 10–24)
BUN: 18 mg/dL (ref 8–27)
Bilirubin Total: 0.6 mg/dL (ref 0.0–1.2)
CO2: 22 mmol/L (ref 20–29)
Calcium: 9.4 mg/dL (ref 8.6–10.2)
Chloride: 103 mmol/L (ref 96–106)
Creatinine, Ser: 1.17 mg/dL (ref 0.76–1.27)
Globulin, Total: 2.1 g/dL (ref 1.5–4.5)
Glucose: 86 mg/dL (ref 70–99)
Potassium: 4.5 mmol/L (ref 3.5–5.2)
Sodium: 139 mmol/L (ref 134–144)
Total Protein: 6.7 g/dL (ref 6.0–8.5)
eGFR: 71 mL/min/{1.73_m2} (ref >59)

## 2023-11-05 LAB — CBC
Hematocrit: 42.3 % (ref 37.5–51.0)
Hemoglobin: 14 g/dL (ref 13.0–17.7)
MCH: 33.3 pg — ABNORMAL HIGH (ref 26.6–33.0)
MCHC: 33.1 g/dL (ref 31.5–35.7)
MCV: 101 fL — ABNORMAL HIGH (ref 79–97)
Platelets: 198 10*3/uL (ref 150–450)
RBC: 4.2 x10E6/uL (ref 4.14–5.80)
RDW: 12.8 % (ref 11.6–15.4)
WBC: 9.1 10*3/uL (ref 3.4–10.8)

## 2023-11-05 LAB — TSH: TSH: 2.19 u[IU]/mL (ref 0.450–4.500)

## 2023-11-05 LAB — LIPOPROTEIN A (LPA): Lipoprotein (a): <8.4 nmol/L (ref <75.0)

## 2023-11-07 ENCOUNTER — Ambulatory Visit: Payer: Self-pay | Admitting: Cardiovascular Disease

## 2023-12-12 DIAGNOSIS — L814 Other melanin hyperpigmentation: Secondary | ICD-10-CM | POA: Diagnosis not present

## 2023-12-12 DIAGNOSIS — L820 Inflamed seborrheic keratosis: Secondary | ICD-10-CM | POA: Diagnosis not present

## 2023-12-12 DIAGNOSIS — D225 Melanocytic nevi of trunk: Secondary | ICD-10-CM | POA: Diagnosis not present

## 2023-12-12 DIAGNOSIS — L821 Other seborrheic keratosis: Secondary | ICD-10-CM | POA: Diagnosis not present

## 2024-01-30 ENCOUNTER — Other Ambulatory Visit: Payer: Self-pay

## 2024-02-01 MED ORDER — ATORVASTATIN CALCIUM 80 MG PO TABS: ORAL_TABLET | ORAL | 0 refills | Status: DC

## 2024-02-07 ENCOUNTER — Other Ambulatory Visit: Payer: Self-pay | Admitting: General Practice

## 2024-02-07 MED ORDER — TICAGRELOR 90 MG PO TABS: 90.0000 mg | ORAL_TABLET | Freq: Two times a day (BID) | ORAL | 1 refills | Status: AC

## 2024-03-14 ENCOUNTER — Other Ambulatory Visit: Payer: Self-pay

## 2024-03-14 MED ORDER — ENTRESTO 49-51 MG PO TABS: 1.0000 | ORAL_TABLET | Freq: Two times a day (BID) | ORAL | 2 refills | Status: AC

## 2024-03-14 NOTE — Telephone Encounter (Signed)
 Pt called in requesting a refill on medication. Medication sent to preferred pharmacy. Confirmation received.

## 2024-03-16 ENCOUNTER — Other Ambulatory Visit: Payer: Self-pay | Admitting: Physician Assistant

## 2024-03-29 ENCOUNTER — Other Ambulatory Visit: Payer: Self-pay | Admitting: Cardiology

## 2024-03-30 MED ORDER — SPIRONOLACTONE 25 MG PO TABS: 12.5000 mg | ORAL_TABLET | Freq: Every day | ORAL | 1 refills | Status: AC

## 2024-04-28 ENCOUNTER — Other Ambulatory Visit: Payer: Self-pay | Admitting: Emergency Medicine

## 2024-06-25 ENCOUNTER — Other Ambulatory Visit: Payer: Self-pay | Admitting: Cardiology

## 2024-06-26 MED ORDER — METOPROLOL SUCCINATE ER 50 MG PO TB24: 50.0000 mg | ORAL_TABLET | Freq: Every day | ORAL | 1 refills | Status: AC

## 2024-06-26 MED ORDER — EMPAGLIFLOZIN 10 MG PO TABS: 10.0000 mg | ORAL_TABLET | Freq: Every day | ORAL | 1 refills | Status: AC

## 2024-07-02 ENCOUNTER — Ambulatory Visit: Admitting: Cardiology

## 2024-07-31 ENCOUNTER — Ambulatory Visit: Admitting: Cardiology
